# Patient Record
Sex: Female | Born: 2000 | Race: Black or African American | Hispanic: No | Marital: Single | Smoking: Never smoker
Health system: Southern US, Community
[De-identification: ages and names within clinical notes are randomized; demographics above are authoritative.]

---

## 2018-06-26 ENCOUNTER — Emergency Department (HOSPITAL_COMMUNITY)
Admission: EM | Admit: 2018-06-26 | Discharge: 2018-06-26 | Disposition: A | Discharge status: Other Acute Inpatient Hospital | Payer: BLUE CROSS/BLUE SHIELD | Source: Home / Self Care | Attending: Emergency Medicine | Admitting: Emergency Medicine

## 2018-06-26 ENCOUNTER — Other Ambulatory Visit: Payer: Self-pay

## 2018-06-26 ENCOUNTER — Inpatient Hospital Stay (HOSPITAL_COMMUNITY)
Admission: AD | Admit: 2018-06-26 | Discharge: 2018-06-29 | DRG: 881 | Disposition: A | Discharge status: Home / Self Care | Payer: BLUE CROSS/BLUE SHIELD | Source: Intra-hospital | Attending: Psychiatry | Admitting: Psychiatry

## 2018-06-26 ENCOUNTER — Encounter (HOSPITAL_COMMUNITY): Payer: Self-pay | Admitting: Emergency Medicine

## 2018-06-26 DIAGNOSIS — Z915 Personal history of self-harm: Secondary | ICD-10-CM

## 2018-06-26 DIAGNOSIS — R45851 Suicidal ideations: Secondary | ICD-10-CM

## 2018-06-26 DIAGNOSIS — T450X1A Poisoning by antiallergic and antiemetic drugs, accidental (unintentional), initial encounter: Secondary | ICD-10-CM | POA: Diagnosis present

## 2018-06-26 DIAGNOSIS — T450X2A Poisoning by antiallergic and antiemetic drugs, intentional self-harm, initial encounter: Secondary | ICD-10-CM | POA: Diagnosis present

## 2018-06-26 DIAGNOSIS — F322 Major depressive disorder, single episode, severe without psychotic features: Secondary | ICD-10-CM | POA: Diagnosis not present

## 2018-06-26 DIAGNOSIS — Z23 Encounter for immunization: Secondary | ICD-10-CM

## 2018-06-26 DIAGNOSIS — F329 Major depressive disorder, single episode, unspecified: Principal | ICD-10-CM | POA: Diagnosis present

## 2018-06-26 DIAGNOSIS — F333 Major depressive disorder, recurrent, severe with psychotic symptoms: Secondary | ICD-10-CM

## 2018-06-26 DIAGNOSIS — F419 Anxiety disorder, unspecified: Secondary | ICD-10-CM | POA: Diagnosis not present

## 2018-06-26 DIAGNOSIS — T391X2A Poisoning by 4-Aminophenol derivatives, intentional self-harm, initial encounter: Secondary | ICD-10-CM | POA: Diagnosis not present

## 2018-06-26 DIAGNOSIS — T1491XA Suicide attempt, initial encounter: Secondary | ICD-10-CM | POA: Diagnosis not present

## 2018-06-26 LAB — COMPREHENSIVE METABOLIC PANEL
ALBUMIN: 3.8 g/dL (ref 3.5–5.0)
ALT: 8 U/L (ref 0–44)
AST: 23 U/L (ref 15–41)
Alkaline Phosphatase: 47 U/L (ref 38–126)
Anion gap: 7 (ref 5–15)
BUN: 5 mg/dL — AB (ref 6–20)
CHLORIDE: 106 mmol/L (ref 98–111)
CO2: 25 mmol/L (ref 22–32)
CREATININE: 0.82 mg/dL (ref 0.44–1.00)
Calcium: 9.3 mg/dL (ref 8.9–10.3)
GFR calc Af Amer: >60 mL/min (ref >60)
GFR calc non Af Amer: >60 mL/min (ref >60)
GLUCOSE: 99 mg/dL (ref 70–99)
POTASSIUM: 3.5 mmol/L (ref 3.5–5.1)
SODIUM: 138 mmol/L (ref 135–145)
Total Bilirubin: 0.8 mg/dL (ref 0.3–1.2)
Total Protein: 7.1 g/dL (ref 6.5–8.1)

## 2018-06-26 LAB — ACETAMINOPHEN LEVEL
Acetaminophen (Tylenol), Serum: 125 ug/mL — ABNORMAL HIGH (ref 10–30)
Acetaminophen (Tylenol), Serum: 76 ug/mL — ABNORMAL HIGH (ref 10–30)

## 2018-06-26 LAB — SALICYLATE LEVEL: Salicylate Lvl: <7 mg/dL (ref 2.8–30.0)

## 2018-06-26 LAB — CBC
HEMATOCRIT: 40.5 % (ref 36.0–46.0)
Hemoglobin: 12 g/dL (ref 12.0–15.0)
MCH: 22.3 pg — ABNORMAL LOW (ref 26.0–34.0)
MCHC: 29.6 g/dL — ABNORMAL LOW (ref 30.0–36.0)
MCV: 75.3 fL — AB (ref 80.0–100.0)
NRBC: 0 % (ref 0.0–0.2)
Platelets: 325 10*3/uL (ref 150–400)
RBC: 5.38 MIL/uL — AB (ref 3.87–5.11)
RDW: 14.9 % (ref 11.5–15.5)
WBC: 8.2 10*3/uL (ref 4.0–10.5)

## 2018-06-26 LAB — I-STAT BETA HCG BLOOD, ED (MC, WL, AP ONLY)

## 2018-06-26 LAB — RAPID URINE DRUG SCREEN, HOSP PERFORMED
AMPHETAMINES: NOT DETECTED
BARBITURATES: NOT DETECTED
Benzodiazepines: NOT DETECTED
COCAINE: NOT DETECTED
Opiates: NOT DETECTED
Tetrahydrocannabinol: POSITIVE — AB

## 2018-06-26 LAB — ETHANOL

## 2018-06-26 MED ORDER — SODIUM CHLORIDE 0.9 % IV BOLUS
1000.0000 mL | Freq: Once | INTRAVENOUS | Status: AC
  Administered 2018-06-26: 1000 mL via INTRAVENOUS

## 2018-06-26 NOTE — ED Notes (Signed)
TTS at bedside. 

## 2018-06-26 NOTE — ED Notes (Signed)
Pt arrived to F8 - ambulatory - wearing burgundy scrubs and eyeglasses. Sitter w/pt. Pt voiced understanding of tx plan - accepted to [facility] 400-1. Pt signed consent form - copy faxed to [facility], copy sent to Medical Records, and original placed in envelope for [facility]. Pt attempting to make phone call from nurses' desk.

## 2018-06-26 NOTE — ED Notes (Signed)
Patient at RN station making phone call to family; pt asked RN if she will be able to leave because she feels she is ok at this point; pt was encouraged to continue with tx or face possible IVC for her safety; pt was tearful and understood; pt returned to room and is currently calm watching TV at this time-Monique,RN

## 2018-06-26 NOTE — ED Notes (Signed)
RN spoke with Revonda Standard at [employer] and given blood levels. RN confirmed pt is cleared medically.

## 2018-06-26 NOTE — BH Assessment (Signed)
Tele Assessment Note   Patient Name: Stephanie Winters MRN: [id number] Referring Physician: EDP Location of Patient: [facility] Location of Provider: Behavioral Health TTS Department  Stephanie Winters is a 17 y.o. female who presented to [facility] after intentionally overdosing on 15+ tabs of Midol.  Pt is a sophomore at [employer].  She lives with two roommates and does not have any outpatient psychiatric/therapy services.    Pt reported as follows:  Since the death of her father on 06-16-18, Pt has experienced the following symptoms:  Despondency; suicidal ideation; auditory hallucination (voices telling her that no one loves her); feelings of worthlessness and hopelessness; fatigue; guilt; poor concentration.  Pt reported also that this morning, following a conflict with her boyfriend, she intentionally overdosed on at least 15 tabs of Midol.  She did not describe it as a suicide attempt.  ''I was just trying to make everything stop.''  Pt denied any previous suicide attempts.  She also stated that she has a history of cutting.  Her most recent cutting was 06/26/18.  Pt denied substance use, homicidal ideation, and visual hallucination.  Pt reported that she felt safe to go home.  During assessment, Pt presented as alert and oriented.  She had good eye contact and was cooperative.  Pt's mood was depressed, and affect was blunted.  Pt endorsed intentional overdose, suicidal ideation, despondency, auditory hallucination, and other symptoms.  Pt denied any substance use concerns.  Pt's speech was normal in rate, rhythm, and volume.  Thought processes were within normal range.  Thought content included auditory hallucination.  Pt's memory and concentration were intact.  Pt's insight, judgment, and impulse control were poor.  Consulted with Ander Slade, NP, who determined that Pt meets inpatient criteria.  Diagnosis: Major Depressive Disorder, Recurrent, Severe, w/psychotic features  Past Medical History:  History reviewed. No pertinent past medical history.  Family History: No family history on file.  Social History:  reports that she does not drink alcohol or use drugs. Her tobacco history is not on file.  Additional Social History:  Alcohol / Drug Use Pain Medications: See MAR Prescriptions: See MAR Over the Counter: See MAR History of alcohol / drug use?: No history of alcohol / drug abuse  CIWA: CIWA-Ar BP: 127/81 Pulse Rate: 67 COWS:    Allergies: No Known Allergies  Home Medications:  (Not in a hospital admission)  OB/GYN Status:  Patient's last menstrual period was 06/16/2018.  General Assessment Data Location of Assessment: [facility] ED TTS Assessment: In system Is this a Tele or Face-to-Face Assessment?: Tele Assessment Is this an Initial Assessment or a Re-assessment for this encounter?: Initial Assessment Patient Accompanied by:: N/A Language Other than English: No Living Arrangements: Other (Comment)(Lives with two roommates) What gender do you identify as?: Female Marital status: Single Maiden name: Mortensen Pregnancy Status: No Living Arrangements: Other (Comment)(2 roommates) Can pt return to current living arrangement?: Yes Admission Status: Voluntary Is patient capable of signing voluntary admission?: Yes Referral Source: Self/Family/Friend Insurance type: [employer]     Crisis Care Plan Living Arrangements: Other (Comment)(2 roommates) Name of Psychiatrist: None Name of Therapist: None  Education Status Is patient currently in school?: Yes Current Grade: Sophomore Highest grade of school patient has completed: 1st year Name of school: [employer]  Risk to self with the past 6 months Suicidal Ideation: Yes-Currently Present Has patient been a risk to self within the past 6 months prior to admission? : No Suicidal Intent: Yes-Currently Present Has patient had any suicidal  intent within the past 6 months prior to admission? : No Is patient at risk for suicide?:  Yes Suicidal Plan?: Yes-Currently Present Has patient had any suicidal plan within the past 6 months prior to admission? : No Specify Current Suicidal Plan: Pt intentionally overdosed on 15+ Midol tabs Access to Means: Yes Specify Access to Suicidal Means: Midol tabs What has been your use of drugs/alcohol within the last 12 months?: Denied(UDS not available at time of assessment) Previous Attempts/Gestures: No Other Self Harm Risks: None indicated Intentional Self Injurious Behavior: Cutting Comment - Self Injurious Behavior: History of cutting -- cut today Family Suicide History: Unknown Recent stressful life event(s): Loss (Comment), Conflict (Comment)(Father died in 2023/06/03; conflict w/boyfriend) Persecutory voices/beliefs?: No Depression: Yes Depression Symptoms: Despondent, Tearfulness, Isolating, Guilt, Loss of interest in usual pleasures, Feeling worthless/self pity Substance abuse history and/or treatment for substance abuse?: No Suicide prevention information given to non-admitted patients: Not applicable  Risk to Others within the past 6 months Homicidal Ideation: No Does patient have any lifetime risk of violence toward others beyond the six months prior to admission? : No Thoughts of Harm to Others: No Current Homicidal Intent: No Current Homicidal Plan: No Access to Homicidal Means: No History of harm to others?: No Assessment of Violence: None Noted Does patient have access to weapons?: No Criminal Charges Pending?: No Does patient have a court date: No Is patient on probation?: No  Psychosis Hallucinations: Auditory(Voices -- see notes) Delusions: None noted  Mental Status Report Appearance/Hygiene: In scrubs, Unremarkable Eye Contact: Good Motor Activity: Freedom of movement, Unremarkable Speech: Logical/coherent Level of Consciousness: Alert Mood: Depressed Affect: Blunted, Appropriate to circumstance Anxiety Level: None Thought Processes: Relevant,  Coherent Judgement: Impaired Orientation: Person, Place, Time, Situation Obsessive Compulsive Thoughts/Behaviors: None  Cognitive Functioning Concentration: Normal Memory: Remote Intact, Recent Intact Is patient IDD: No Insight: Fair Impulse Control: Poor Appetite: Fair Have you had any weight changes? : No Change Sleep: No Change Total Hours of Sleep: 7 Vegetative Symptoms: None  ADLScreening [facility] Assessment Services) Patient's cognitive ability adequate to safely complete daily activities?: Yes Patient able to express need for assistance with ADLs?: Yes Independently performs ADLs?: Yes (appropriate for developmental age)  Prior Inpatient Therapy Prior Inpatient Therapy: No  Prior Outpatient Therapy Prior Outpatient Therapy: No Does patient have an ACCT team?: No Does patient have Intensive In-House Services?  : No Does patient have [facility] services? : No Does patient have P4CC services?: No  ADL Screening (condition at time of admission) Patient's cognitive ability adequate to safely complete daily activities?: Yes Is the patient deaf or have difficulty hearing?: No Does the patient have difficulty seeing, even when wearing glasses/contacts?: No Does the patient have difficulty concentrating, remembering, or making decisions?: No Patient able to express need for assistance with ADLs?: Yes Does the patient have difficulty dressing or bathing?: No Independently performs ADLs?: Yes (appropriate for developmental age) Does the patient have difficulty walking or climbing stairs?: No Weakness of Legs: None Weakness of Arms/Hands: None  Home Assistive Devices/Equipment Home Assistive Devices/Equipment: None  Therapy Consults (therapy consults require a physician order) PT Evaluation Needed: No OT Evalulation Needed: No SLP Evaluation Needed: No Abuse/Neglect Assessment (Assessment to be complete while patient is alone) Abuse/Neglect Assessment Can Be Completed:  Yes Physical Abuse: Denies Verbal Abuse: Denies Sexual Abuse: Denies Exploitation of patient/patient's resources: Denies Self-Neglect: Denies Values / Beliefs Cultural Requests During Hospitalization: None Spiritual Requests During Hospitalization: None Consults Spiritual Care Consult Needed: No Social Work Librarian, academic  Needed: No Advance Directives (For Healthcare) Does Patient Have a Medical Advance Directive?: No          Disposition:  Disposition Initial Assessment Completed for this Encounter: Yes Disposition of Patient: Admit Type of inpatient treatment program: Adult(Per T. Starkes, NP)  This service was provided via telemedicine using a 2-way, interactive audio and Immunologist.  Names of all persons participating in this telemedicine service and their role in this encounter. Name: Stephanie, Winters Role: Patient             Earline Mayotte 06/26/2018 4:10 PM

## 2018-06-26 NOTE — ED Triage Notes (Addendum)
Pt states her father passed away 06-06-2023 in his sleep. States she has been depressed since then and today she took about 15 pamprin, containing 500 mg tylenol each, 25mh of pamabrom each and 15 mg of pyrilamine maleate each. Pt tearful at triage, states her sister told her to come in. She states she does have a good relationship with her mom but she is hard to talk to about things. Pt also has noted cut marks to left wrist, no active bleeding she admits she also cut herself with her eye brow razor. Pt has been wanded by security.  Per poison control ALLISON: give 1g/kg Activated charcoal PO & 4hr post ingestion tylenol. watch for tachycardia and seizures. Treat with fluids/ativan as needed. Also get an EKG and Asprin level. Obs time of 6 hours from time of ingestion, as long as asymptomatic with normal vitals.

## 2018-06-26 NOTE — ED Notes (Signed)
Patient ambulatory to bathroom with steady gait at this time to provide urine sample 

## 2018-06-26 NOTE — ED Provider Notes (Signed)
[facility] EMERGENCY DEPARTMENT Provider Note   CSN: [id number] Arrival date & time: 06/26/18  1136     History   Chief Complaint Chief Complaint  Patient presents with  . Suicide Attempt  . Drug Overdose    HPI Stephanie Winters is a 17 y.o. female.  17 y.o female with no PMH presents to the ED s/p taking 15+ Midol pills this morning.  Patient reports she took two handful of Midol pills because she was trying to make the voices go away.She reports auditory hallucinations of people talk about her "they are talking bad about me". She also reports she's been sad as her boyfriend recently broke up with her. Patient reports her sister wanted her to come be evaluated but she feels ok. She denies having any previous SI attempts and reports she was not trying to hurt herself with the Midol but wanting the voices to stop. She denies any HI, visual hallucinations, chest pain or shortness of breath.      OB History   None      Home Medications    Prior to Admission medications   Not on File    Family History No family history on file.  Social History Social History   Tobacco Use  . Smoking status: Not on file  Substance Use Topics  . Alcohol use: Never    Frequency: Never    Comment: Pt denied  . Drug use: Never    Comment: Pt denied: UDS not available     Allergies   Patient has no known allergies.   Review of Systems Review of Systems  Constitutional: Negative for chills and fever.  Respiratory: Negative for shortness of breath.   Cardiovascular: Negative for chest pain.  Gastrointestinal: Negative for abdominal pain.  Genitourinary: Negative for dysuria and flank pain.  Musculoskeletal: Negative for back pain.  Skin: Negative for pallor and wound.  Neurological: Negative for light-headedness and headaches.  Psychiatric/Behavioral: Positive for hallucinations (auditory).  All other systems reviewed and are negative.    Physical  Exam Updated Vital Signs BP 109/73   Pulse 68   Temp 98.9 F (37.2 C)   Resp 15   LMP 06/16/2018   SpO2 100%   Physical Exam  Constitutional: She is oriented to person, place, and time. She appears well-developed and well-nourished.  HENT:  Head: Normocephalic and atraumatic.  Eyes: Pupils are equal, round, and reactive to light.  Neck: Normal range of motion. Neck supple.  Cardiovascular: Normal heart sounds.  Pulmonary/Chest: Effort normal.  Abdominal: Soft. There is no tenderness.  Musculoskeletal: She exhibits no tenderness.  Neurological: She is alert and oriented to person, place, and time.  Skin: Skin is warm and dry.  Nursing note and vitals reviewed.    ED Treatments / Results  Labs (all labs ordered are listed, but only abnormal results are displayed) Labs Reviewed  COMPREHENSIVE METABOLIC PANEL - Abnormal; Notable for the following components:      Result Value   BUN 5 (*)    All other components within normal limits  ACETAMINOPHEN LEVEL - Abnormal; Notable for the following components:   Acetaminophen (Tylenol), Serum 125 (*)    All other components within normal limits  CBC - Abnormal; Notable for the following components:   RBC 5.38 (*)    MCV 75.3 (*)    MCH 22.3 (*)    MCHC 29.6 (*)    All other components within normal limits  ACETAMINOPHEN LEVEL - Abnormal; Notable  for the following components:   Acetaminophen (Tylenol), Serum 76 (*)    All other components within normal limits  ETHANOL  SALICYLATE LEVEL  SALICYLATE LEVEL  RAPID URINE DRUG SCREEN, HOSP PERFORMED  I-STAT BETA HCG BLOOD, ED ([facility], [facility], [facility] ONLY)    EKG EKG Interpretation  Date/Time:  Sunday June 26 2018 12:03:39 EDT Ventricular Rate:  86 PR Interval:  136 QRS Duration: 66 QT Interval:  400 QTC Calculation: 478 R Axis:   90 Text Interpretation:  Normal sinus rhythm with sinus arrhythmia Rightward axis Septal infarct , age undetermined No previous tracing Confirmed by  Gwyneth Sprout ([id number]) on 06/26/2018 12:26:08 PM   Radiology No results found.  Procedures Procedures (including critical care time)  Medications Ordered in ED Medications  sodium chloride 0.9 % bolus 1,000 mL (0 mLs Intravenous Stopped 06/26/18 1531)     Initial Impression / Assessment and Plan / ED Course  I have reviewed the triage vital signs and the nursing notes.  Pertinent labs & imaging results that were available during my care of the patient were reviewed by me and considered in my medical decision making (see chart for details).    Presents after taking 13+ vitals in order to make the voices go away.  She reports has been hearing people talk about her and say bad stuff about her.  Patient also reports is been sad lately as her father's birthday is today and he passed away several years ago.  Patient also reports her boyfriend recently broke up with her which caused her to get really upset.  She does have 2 superficial lacerations to her left wrist and when asked about these patient reports she attempted to hurt her self this morning.  Patient denies any previous suicidal attempts, Ms. Ethelle Lyon ideations, visual hallucinations.  Poison control was contacted by nursing staff and myself, they recommended patient be monitor for 6 hours of observation along with repeating a 4-hour postingestion Tylenol and salicylate levels. I will provide patient with 1L bolus to treat help with her tachycardia. TTS consult will be placed after observation period.   Current acetaminophen level is 125, and ethanol <10, Salicylate < 7.0. CBC showed no leukocytosis, beta HCg was < 5.0. Consult to TTS placed.   5:36 PM Patient's repeat Salicylate level is <7.0, acetaminophen level is now 76. Patient's HR is now 68 after fluids, she is now medically cleared for TTS placement and evaluation.   Final Clinical Impressions(s) / ED Diagnoses   Final diagnoses:  Suicidal ideation    ED Discharge  Orders    None       Claude Manges, PA-C 06/26/18 1738    Gwyneth Sprout, MD 06/28/18 1407

## 2018-06-26 NOTE — ED Notes (Signed)
RN attempted to call report to [facility]; RN to call back-Monique,RN

## 2018-06-27 ENCOUNTER — Other Ambulatory Visit: Payer: Self-pay

## 2018-06-27 ENCOUNTER — Encounter (HOSPITAL_COMMUNITY): Payer: Self-pay

## 2018-06-27 DIAGNOSIS — F419 Anxiety disorder, unspecified: Secondary | ICD-10-CM

## 2018-06-27 DIAGNOSIS — T1491XA Suicide attempt, initial encounter: Secondary | ICD-10-CM

## 2018-06-27 DIAGNOSIS — F322 Major depressive disorder, single episode, severe without psychotic features: Secondary | ICD-10-CM | POA: Diagnosis present

## 2018-06-27 DIAGNOSIS — T450X2A Poisoning by antiallergic and antiemetic drugs, intentional self-harm, initial encounter: Secondary | ICD-10-CM

## 2018-06-27 DIAGNOSIS — T391X2A Poisoning by 4-Aminophenol derivatives, intentional self-harm, initial encounter: Secondary | ICD-10-CM

## 2018-06-27 LAB — LIPID PANEL
CHOLESTEROL: 121 mg/dL (ref 0–200)
HDL: 51 mg/dL (ref >40)
LDL CALC: 61 mg/dL (ref 0–99)
TRIGLYCERIDES: 43 mg/dL (ref <150)
Total CHOL/HDL Ratio: 2.4 RATIO
VLDL: 9 mg/dL (ref 0–40)

## 2018-06-27 LAB — TSH: TSH: 1.455 u[IU]/mL (ref 0.350–4.500)

## 2018-06-27 MED ORDER — HYDROXYZINE HCL 50 MG PO TABS
50.0000 mg | ORAL_TABLET | Freq: Once | ORAL | Status: AC
  Administered 2018-06-27: 50 mg via ORAL
  Filled 2018-06-27 (×2): qty 1

## 2018-06-27 MED ORDER — MAGNESIUM HYDROXIDE 400 MG/5ML PO SUSP: 30.0000 mL | Freq: Every day | ORAL | Status: DC | PRN

## 2018-06-27 MED ORDER — ACETAMINOPHEN 325 MG PO TABS
650.0000 mg | ORAL_TABLET | Freq: Four times a day (QID) | ORAL | Status: DC | PRN
  Filled 2018-06-27: qty 2

## 2018-06-27 MED ORDER — LORAZEPAM 0.5 MG PO TABS: 0.5000 mg | ORAL_TABLET | Freq: Four times a day (QID) | ORAL | Status: DC | PRN

## 2018-06-27 MED ORDER — ALUM & MAG HYDROXIDE-SIMETH 200-200-20 MG/5ML PO SUSP: 30.0000 mL | ORAL | Status: DC | PRN

## 2018-06-27 NOTE — Plan of Care (Addendum)
Patient was pleasant this morning upon approach. Patient said she slept okay, and her anxiety is under control this morning. Patient is not on any medications, denied SI HI AVH, and denied physical pain. Safety is maintained with 15 minute checks as well as environmental checks. Will continue to monitor.  Problem: Education: Goal: Ability to make informed decisions regarding treatment will improve Outcome: Progressing   Problem: Coping: Goal: Coping ability will improve Outcome: Progressing   Problem: Education: Goal: Knowledge of the prescribed therapeutic regimen will improve Outcome: Progressing   Problem: Coping: Goal: Coping ability will improve Outcome: Progressing Goal: Will verbalize feelings Outcome: Progressing

## 2018-06-27 NOTE — Progress Notes (Signed)
Recreation Therapy Notes  Date: 10.21.19 Time: 0930 Location: 300 Hall Dayroom  Group Topic: Stress Management  Goal Area(s) Addresses:  Patient will verbalize importance of using healthy stress management.  Patient will identify positive emotions associated with healthy stress management.   Intervention: Stress Management  Activity : Meditation.  LRT introduced the stress management technique of meditation.  LRT played a meditation that dealt with impermanence.  Patients were to follow along as the meditation played to engage in the meditation.  Education:  Stress Management, Discharge Planning.   Education Outcome: Acknowledges edcuation/In group clarification offered/Needs additional education  Clinical Observations/Feedback: Pt did not attend group.    Caroll Rancher, LRT/CTRS         Lillia Abed, [place] Pelto A 06/27/2018 11:26 AM
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
## 2018-06-27 NOTE — H&P (Addendum)
Psychiatric Admission Assessment Adult  Patient Identification: Stephanie Winters MRN:  [id number] Date of Evaluation:  06/27/2018 Chief Complaint:  " I was upset when I took the pills" Principal Diagnosis:  S/P Overdose  Diagnosis:   Patient Active Problem List   Diagnosis Date Noted  . MDD (major depressive disorder), single episode, severe (HCC) [F32.2] 06/27/2018   History of Present Illness: 17 year old female, college student, presented to the hospital ED after overdosing on Pamprin, Pyrilamine. States she took " a handful" of each. She states overdose was impulsive, unplanned, and states " I was feeling overwhelmed that day", related to conflict with her boyfriend.. States " I don't think I was trying to die, I really don't know what I was thinking". Also reports that her father passed away on 06-12-18.  States she was experiencing grief/loss,but reports " I was feeling better, and had started going back to classes and to work".  She states " really I think it was more of having a bad day than being depressed ". Currently does not endorse neuro-vegetative symptoms of depression, and reports sleep, energy level, appetite have been within normal, denies anhedonia. Denies any prior suicidal ideations . Of note initial [facility] assessment note indicates she reported some auditory hallucinations . At this time denies/does not endorse psychotic symptoms, does not appear internally preoccupied, no delusions expressed .   Associated Signs/Symptoms: Depression Symptoms:  suicidal attempt, (Hypo) Manic Symptoms:  None noted  Anxiety Symptoms:  Does not endorse Psychotic Symptoms:  Denies  PTSD Symptoms: Does not endorse  Total Time spent with patient: 45 minutes  Past Psychiatric History: no prior psychiatric admissions, no history of prior suicide attempts, reports history of an isolated episode of self cutting after her father passed away recently but no other history of self injurious  behaviors, no history of psychosis, denies history of PTSD. Denies history of violence . Denies panic or agoraphobia, denies social anxiety symptoms at this time  Is the patient at risk to self? Yes.    Has the patient been a risk to self in the past 6 months? No.  Has the patient been a risk to self within the distant past? No.  Is the patient a risk to others? No.  Has the patient been a risk to others in the past 6 months? No.  Has the patient been a risk to others within the distant past? No.   Prior Inpatient Therapy:  denies  Prior Outpatient Therapy:  no outpatient psychiatric services   Alcohol Screening: 1. How often do you have a drink containing alcohol?: Monthly or less 2. How many drinks containing alcohol do you have on a typical day when you are drinking?: 1 or 2 3. How often do you have six or more drinks on one occasion?: Never AUDIT-C Score: 1 4. How often during the last year have you found that you were not able to stop drinking once you had started?: Never 5. How often during the last year have you failed to do what was normally expected from you becasue of drinking?: Never 6. How often during the last year have you needed a first drink in the morning to get yourself going after a heavy drinking session?: Never 7. How often during the last year have you had a feeling of guilt of remorse after drinking?: Never 8. How often during the last year have you been unable to remember what happened the night before because you had been drinking?: Never  9. Have you or someone else been injured as a result of your drinking?: No 10. Has a relative or friend or a doctor or another health worker been concerned about your drinking or suggested you cut down?: No Alcohol Use Disorder Identification Test Final Score (AUDIT): 1 Intervention/Follow-up: AUDIT Score <7 follow-up not indicated Substance Abuse History in the last 12 months: denies alcohol abuse , denies drug abuse   Consequences of Substance Abuse: Denies  Previous Psychotropic Medications: reports she has not been prescribed psychiatric medications in the past and was not taking any medications on admission. Psychological Evaluations: no  Past Medical History: No history of medical illness, NKDA. Family History: father died on 06/06/18, states he was chronically ill, mother lives in [place]. She has 6 half siblings, has one twin sister  Family Psychiatric  History: no history of mental illness, cousin has history of drug abuse, no suicides in family Tobacco Screening: Have you used any form of tobacco in the last 30 days? 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 Social History   Substance and Sexual Activity  Alcohol Use Never  . Frequency: Never   Comment: Pt denied     Social History   Substance and Sexual Activity  Drug Use Yes  . Types: Marijuana   Comment: Pt denied: UDS not available    Additional Social History: Marital status: Long term relationship Long term relationship, how long?: 1 year  What types of issues is patient dealing with in the relationship?: Patient reports she and her boyfriend are "on the rocks". She states that she fears that they are on the verge to break up.  Additional relationship information: No Are you sexually active?: Yes What is your sexual orientation?: Heterosexual  Has your sexual activity been affected by drugs, alcohol, medication, or emotional stress?: No  Does patient have children?: No  Allergies:  Not on File Lab Results:  Results for orders placed or performed during the hospital encounter of 06/26/18 (from the past 48 hour(s))  Lipid panel     Status: None   Collection Time: 06/27/18  6:29 AM  Result Value Ref Range   Cholesterol 121 0 - 200 mg/dL    Triglycerides 43 <409 mg/dL   HDL 51 >81 mg/dL   Total CHOL/HDL Ratio 2.4 RATIO   VLDL 9 0 - 40 mg/dL   LDL Cholesterol 61 0 - 99 mg/dL    Comment:        Total Cholesterol/HDL:CHD Risk Coronary Heart Disease Risk Table                     Men   Women  1/2 Average Risk   3.4   3.3  Average Risk       5.0   4.4  2 X Average Risk   9.6   7.1  3 X Average Risk  23.4   11.0        Use the calculated Patient Ratio above and the CHD Risk Table to determine the patient's CHD Risk.        ATP III CLASSIFICATION (LDL):  <100     mg/dL   Optimal  191-478  mg/dL   Near or Above                    Optimal  130-159  mg/dL   Borderline  161-096  mg/dL   High  >045     mg/dL   Very High Performed at [facility], [address], [place], [place] [address]   TSH     Status: None   Collection Time: 06/27/18  6:29 AM  Result Value Ref Range   TSH 1.455 0.350 - 4.500 uIU/mL    Comment: Performed by a 3rd Generation assay with a functional sensitivity of <=0.01 uIU/mL. Performed at [facility], [address], [place], [place] [address]     Blood Alcohol level:  Lab Results  Component Value Date   ETH <10 06/26/2018    Metabolic Disorder Labs:  No results found for: HGBA1C, MPG No results found for: PROLACTIN Lab Results  Component Value Date   CHOL 121 06/27/2018   TRIG 43 06/27/2018   HDL 51 06/27/2018   CHOLHDL 2.4 06/27/2018   VLDL 9 06/27/2018   LDLCALC 61 06/27/2018    Current Medications: Current Facility-Administered Medications  Medication Dose Route Frequency Provider Last Rate Last Dose  . [START ON 06/28/2018] acetaminophen (TYLENOL) tablet 650 mg  650 mg Oral Q6H PRN Maryagnes Amos, FNP      . alum & mag hydroxide-simeth (MAALOX/MYLANTA) 200-200-20 MG/5ML suspension 30 mL  30 mL Oral Q4H PRN Starkes-Perry, Juel Burrow, FNP      . magnesium hydroxide (MILK OF MAGNESIA) suspension 30 mL  30 mL Oral Daily PRN Starkes-Perry,  Juel Burrow, FNP       PTA Medications: No medications prior to admission.    Musculoskeletal: Strength & Muscle Tone: within normal limits Gait & Station: normal Patient leans: N/A  Psychiatric Specialty Exam: Physical Exam  Review of Systems  Constitutional: Negative.   HENT: Negative.   Eyes: Negative.   Respiratory: Negative.   Cardiovascular: Negative.   Gastrointestinal: Negative.   Genitourinary: Negative.   Musculoskeletal: Negative.   Skin: Negative.   Neurological: Negative.   Endo/Heme/Allergies: Negative.   Psychiatric/Behavioral: Positive for depression. The patient is nervous/anxious.   All other systems reviewed and are negative.   Blood pressure 115/67, pulse 62, temperature 98.3 F (36.8 C), temperature source Oral, resp. rate 16, height 5\' 2"  (1.575 m), weight 75.3 kg, last menstrual period 06/16/2018.Body mass index is 30.36 kg/m.  General Appearance: Well Groomed  Eye Contact:  Good  Speech:  Normal Rate  Volume:  Normal  Mood:  reports improving mood, states " I feel better", describes mood as 8/10 with 10 being best   Affect:  Appropriate and reactive, vaguely anxious   Thought Process:  Linear and Descriptions of Associations: Intact  Orientation:  Full (Time, Place, and Person)  Thought Content:  no hallucinations, no delusions, not internally preoccupied   Suicidal Thoughts:  No denies suicidal or self injurious ideations , denies homicidal or violent ideations  Homicidal Thoughts:  No  Memory:  recent and remote grossly intact   Judgement:  Other:  fair  Insight:  Fair  Psychomotor Activity:  Normal  Concentration:  Concentration: Good and Attention Span: Good  Recall:  Good  Fund of Knowledge:  Good  Language:  Good  Akathisia:  Negative  Handed:  Right  AIMS (if indicated):     Assets:  Communication Skills Desire for Improvement Resilience  ADL's:  Intact  Cognition:  WNL  Sleep:  Number of Hours: 5    Treatment Plan  Summary: Daily contact with patient to assess and evaluate symptoms and  progress in treatment, Medication management, Plan inpatient treatment and medications as below  Observation Level/Precautions:  15 minute checks  Laboratory:  as needed   Will recheck Hepatic Function Tests, following recent acetaminophen overdose.    Psychotherapy:  Milieu, group therapy   Medications:  We discussed options- patient minimizes depression, reports overdose not associated with severe or significant depressive episode, at this time not interested in medication management / starting a standing psychiatric medication. Ativan 0.5 mgrs Q 6 hours PRN for anxiety as needed .   Consultations:  As needed   Discharge Concerns:  -  Estimated LOS: 3-4 days   Other:     Physician Treatment Plan for Primary Diagnosis: S/P Overdose  Long Term Goal(s): Improvement in symptoms so as ready for discharge  Short Term Goals: Ability to identify changes in lifestyle to reduce recurrence of condition will improve, Ability to verbalize feelings will improve, Ability to disclose and discuss suicidal ideas, Ability to demonstrate self-control will improve, Ability to identify and develop effective coping behaviors will improve and Ability to maintain clinical measurements within normal limits will improve  Physician Treatment Plan for Secondary Diagnosis: Bereavement  Long Term Goal(s): Improvement in symptoms so as ready for discharge  Short Term Goals: Ability to identify and develop effective coping behaviors will improve  I certify that inpatient services furnished can reasonably be expected to improve the patient's condition.    Craige Cotta, MD 10/21/20191:40 PM

## 2018-06-27 NOTE — BHH Group Notes (Signed)
LCSW Group Therapy Note 06/27/2018 1:52 PM  Type of Therapy and Topic: Group Therapy: Overcoming Obstacles  Participation Level: Active  Description of Group:  In this group patients will be encouraged to explore what they see as obstacles to their own wellness and recovery. They will be guided to discuss their thoughts, feelings, and behaviors related to these obstacles. The group will process together ways to cope with barriers, with attention given to specific choices patients can make. Each patient will be challenged to identify changes they are motivated to make in order to overcome their obstacles. This group will be process-oriented, with patients participating in exploration of their own experiences as well as giving and receiving support and challenge from other group members.  Therapeutic Goals: 1. Patient will identify personal and current obstacles as they relate to admission. 2. Patient will identify barriers that currently interfere with their wellness or overcoming obstacles.  3. Patient will identify feelings, thought process and behaviors related to these barriers. 4. Patient will identify two changes they are willing to make to overcome these obstacles:   Summary of Patient Progress  Stephanie Winters was engaged and participated throughout the group session. Stephanie Winters reports that she struggles with "the fear of failing". Stephanie Winters reports that her personal mindset is her main barrier and that she plans on learning how to manage her thinking and emotions.    Therapeutic Modalities:  Cognitive Behavioral Therapy Solution Focused Therapy Motivational Interviewing Relapse Prevention Therapy   Alcario Drought Clinical Social Worker

## 2018-06-27 NOTE — B[facility] Group Notes (Signed)
[facility] Group Notes:  (Nursing/MHT/Case Management/Adjunct)  Date:  06/27/2018  Time:  4:15 pm  Type of Therapy:  Psychoeducational Skills  Participation Level:  Active  Participation Quality:  Appropriate  Affect:  Appropriate  Cognitive:  Appropriate  Insight:  Appropriate  Engagement in Group:  Engaged  Modes of Intervention:  Education  Summary of Progress/Problems: Patient attended group, listened and responded appropriately.    Stephanie Winters 06/27/2018, 6:18 PM

## 2018-06-27 NOTE — Progress Notes (Signed)
Patient ID: Stephanie Winters, female   DOB: 2001-09-03, 17 y.o.   MRN: [id number]   D: Patient pleasant on approach today. Talking with peers. Reports mood improved. Currently denies feelings of depression or any active SI. Contracts on the unit. A: Staff will monitor on q 15 minute checks, follow treatment plan, and give medications as ordered. R: Cooperative on the unit.

## 2018-06-27 NOTE — Tx Team (Signed)
Initial Treatment Plan 06/27/2018 12:43 AM Stephanie Winters ZOX:[id number]    PATIENT STRESSORS: Loss of father 05/18/2018 Other: greiving    PATIENT STRENGTHS: Ability for insight Average or above average intelligence Communication skills General fund of knowledge Supportive family/friends   PATIENT IDENTIFIED PROBLEMS:   depression  anxiety  Risk for suicide  "wants to cope better"  "learn coping skills"  "learn to communicate feelings"          DISCHARGE CRITERIA:  Ability to meet basic life and health needs Adequate post-discharge living arrangements Improved stabilization in mood, thinking, and/or behavior Need for constant or close observation no longer present Verbal commitment to aftercare and medication compliance  PRELIMINARY DISCHARGE PLAN: Attend aftercare/continuing care group Participate in family therapy Return to previous living arrangement Return to previous work or school arrangements  PATIENT/FAMILY INVOLVEMENT: This treatment plan has been presented to and reviewed with the patient, Stephanie Winters,  The patient and family have been given the opportunity to ask questions and make suggestions.  Earnest Conroy, RN 06/27/2018, 12:43 AM

## 2018-06-27 NOTE — Progress Notes (Signed)
Adult Psychoeducational Group Note  Date:  06/27/2018 Time:  10:41 PM  Group Topic/Focus:  Wrap-Up Group:   The focus of this group is to help patients review their daily goal of treatment and discuss progress on daily workbooks.  Participation Level:  Active  Participation Quality:  Appropriate   Affect:  Appropriate  Cognitive:  Appropriate  Insight: Appropriate  Engagement in Group:  Engaged  Modes of Intervention:  Discussion  Additional Comments:  Pt's goals were to communicate and socialize more.  Pt stated that she met her goals.  Pt rated the day at a 10/10.  Dreon Pineda 06/27/2018, 10:41 PM

## 2018-06-27 NOTE — [facility] Suicide Risk Assessment (Addendum)
[facility] Admission Suicide Risk Assessment   Nursing information obtained from:  Patient Demographic factors:  Unemployed, Adolescent or young adult Current Mental Status:  Plan includes specific time, place, or method, Self-harm behaviors Loss Factors:  Loss of significant relationship Historical Factors:  Prior suicide attempts Risk Reduction Factors:  Positive social support  Total Time spent with patient: 45 minutes Principal Problem: S/P Overdose  Diagnosis:   Patient Active Problem List   Diagnosis Date Noted  . MDD (major depressive disorder), single episode, severe (HCC) [F32.2] 06/27/2018   Subjective Data:   Continued Clinical Symptoms:  Alcohol Use Disorder Identification Test Final Score (AUDIT): 1 The "Alcohol Use Disorders Identification Test", Guidelines for Use in Primary Care, Second Edition.  World Science writer [facility]). Score between 0-7:  no or low risk or alcohol related problems. Score between 8-15:  moderate risk of alcohol related problems. Score between 16-19:  high risk of alcohol related problems. Score 20 or above:  warrants further diagnostic evaluation for alcohol dependence and treatment.   CLINICAL FACTORS:  17 year old single female, college student, presented to ED following overdose on acetaminophen-containing medication.  Reports overdose was impulsive, unplanned and related to feeling acutely overwhelmed at the time.  Today minimizes depression or neurovegetative symptoms and states she was not experiencing any suicidal ideations prior to event.  Her father passed away on 04-Jun-2018, describes some bereavement but does not attribute overdose to this loss.  Psychiatric Specialty Exam: Physical Exam  ROS  Blood pressure 115/67, pulse 62, temperature 98.3 F (36.8 C), temperature source Oral, resp. rate 16, height 5\' 2"  (1.575 m), weight 75.3 kg, last menstrual period 06/16/2018.Body mass index is 30.36 kg/m.  See admit note  MSE  COGNITIVE FEATURES THAT CONTRIBUTE TO RISK:  Closed-mindedness and Loss of executive function    SUICIDE RISK:   Moderate:  Frequent suicidal ideation with limited intensity, and duration, some specificity in terms of plans, no associated intent, good self-control, limited dysphoria/symptomatology, some risk factors present, and identifiable protective factors, including available and accessible social support.  PLAN OF CARE: Patient will be admitted to inpatient psychiatric unit for stabilization and safety. Will provide and encourage milieu participation. Provide medication management and maked adjustments as needed.  Will follow daily.    I certify that inpatient services furnished can reasonably be expected to improve the patient's condition.   Craige Cotta, MD 06/27/2018, 2:08 PM

## 2018-06-27 NOTE — Tx Team (Signed)
Interdisciplinary Treatment and Diagnostic Plan Update  06/27/2018 Time of Session:  Stephanie Winters MRN: [id number]  Principal Diagnosis: <principal problem not specified>  Secondary Diagnoses: Active Problems:   MDD (major depressive disorder), single episode, severe (HCC)   Current Medications:  Current Facility-Administered Medications  Medication Dose Route Frequency Provider Last Rate Last Dose  . [START ON 06/28/2018] acetaminophen (TYLENOL) tablet 650 mg  650 mg Oral Q6H PRN Suella Broad, FNP      . alum & mag hydroxide-simeth (MAALOX/MYLANTA) 200-200-20 MG/5ML suspension 30 mL  30 mL Oral Q4H PRN Starkes-Perry, Gayland Curry, FNP      . magnesium hydroxide (MILK OF MAGNESIA) suspension 30 mL  30 mL Oral Daily PRN Starkes-Perry, Gayland Curry, FNP       PTA Medications: No medications prior to admission.    Patient Stressors: Loss of father 05/18/2018 Other: greiving   Patient Strengths: Ability for insight Average or above average intelligence Communication skills General fund of knowledge Supportive family/friends  Treatment Modalities: Medication Management, Group therapy, Case management,  1 to 1 session with clinician, Psychoeducation, Recreational therapy.   Physician Treatment Plan for Primary Diagnosis: <principal problem not specified> Long Term Goal(s):     Short Term Goals:    Medication Management: Evaluate patient's response, side effects, and tolerance of medication regimen.  Therapeutic Interventions: 1 to 1 sessions, Unit Group sessions and Medication administration.  Evaluation of Outcomes: Not Met  Physician Treatment Plan for Secondary Diagnosis: Active Problems:   MDD (major depressive disorder), single episode, severe (Stephanie Winters)  Long Term Goal(s):     Short Term Goals:       Medication Management: Evaluate patient's response, side effects, and tolerance of medication regimen.  Therapeutic Interventions: 1 to 1 sessions, Unit Group sessions  and Medication administration.  Evaluation of Outcomes: Not Met   RN Treatment Plan for Primary Diagnosis: <principal problem not specified> Long Term Goal(s): Knowledge of disease and therapeutic regimen to maintain health will improve  Short Term Goals: Ability to participate in decision making will improve, Ability to verbalize feelings will improve, Ability to disclose and discuss suicidal ideas and Ability to identify and develop effective coping behaviors will improve  Medication Management: RN will administer medications as ordered by provider, will assess and evaluate patient's response and provide education to patient for prescribed medication. RN will report any adverse and/or side effects to prescribing provider.  Therapeutic Interventions: 1 on 1 counseling sessions, Psychoeducation, Medication administration, Evaluate responses to treatment, Monitor vital signs and CBGs as ordered, Perform/monitor CIWA, COWS, AIMS and Fall Risk screenings as ordered, Perform wound care treatments as ordered.  Evaluation of Outcomes: Not Met   LCSW Treatment Plan for Primary Diagnosis: <principal problem not specified> Long Term Goal(s): Safe transition to appropriate next level of care at discharge, Engage patient in therapeutic group addressing interpersonal concerns.  Short Term Goals: Engage patient in aftercare planning with referrals and resources  Therapeutic Interventions: Assess for all discharge needs, 1 to 1 time with Social worker, Explore available resources and support systems, Assess for adequacy in community support network, Educate family and significant other(s) on suicide prevention, Complete Psychosocial Assessment, Interpersonal group therapy.  Evaluation of Outcome s: Not Met   Progress in Treatment: Attending groups: No. New to unit  Participating in groups: No. Taking medication as prescribed: Yes. Toleration medication: Yes. Family/Significant other contact made:  No, will contact:  if patient provides consent for collateral contacts Patient understands diagnosis: Yes. Discussing patient identified problems/goals with staff:  Yes. Medical problems stabilized or resolved: Yes. Denies suicidal/homicidal ideation: No. Issues/concerns per patient self-inventory: No. Other:   New problem(s) identified: None   New Short Term/Long Term Goal(s):medication stabilization, elimination of SI thoughts, development of comprehensive mental wellness plan.   Patient Goals:  I want to learn better coping skills and how to communicate my feelings  Discharge Plan or Barriers: CSW will assess for appropriate referrals and discharge planning.   Reason for Continuation of Hospitalization: Anxiety Depression Medication stabilization  Estimated Length of Stay: 3-5 days   Attendees: Patient: 06/27/2018 8:36 AM  Physician: Dr. Neita Garnet, MD 06/27/2018 8:36 AM  Nursing: Yetta Flock.B,RN 06/27/2018 8:36 AM  RN Care Manager: Lars Pinks, RN 06/27/2018 8:36 AM  Social Worker: Radonna Ricker, [place] 06/27/2018 8:36 AM  Recreational Therapist: Rhunette Croft 06/27/2018 8:36 AM  Other:  06/27/2018 8:36 AM  Other:  06/27/2018 8:36 AM  Other: 06/27/2018 8:36 AM    Scribe for Treatment Team: Marylee Floras, [place] 06/27/2018 8:36 AM

## 2018-06-27 NOTE — [facility] Suicide Risk Assessment (Signed)
[facility] INPATIENT:  Family/Significant Other Suicide Prevention Education  Suicide Prevention Education:  Contact Attempts: Rosemarie Beath, boyfriend [phone]) has been identified by the patient as the family member/significant other with whom the patient will be residing, and identified as the person(s) who will aid the patient in the event of a mental health crisis.  With written consent from the patient, two attempts were made to provide suicide prevention education, prior to and/or following the patient's discharge.  We were unsuccessful in providing suicide prevention education.  A suicide education pamphlet was given to the patient to share with family/significant other.  Date and time of first attempt: 06/27/18 / 4:00pm   Annalysse Shoemaker E Giavanni Zeitlin 06/27/2018, 4:00 PM

## 2018-06-27 NOTE — Progress Notes (Signed)
Patient ID: Stephanie Winters, female   DOB: [birth year], 17 y.o.   MRN: [id number] Admission note: Patient is a voluntary admission admitted for intentionally overdosing on 15+ tablet of Midol.  Pt is a sophomore at [employer].  She lives with two roommates and does not have any outpatient psychiatric/therapy services. Pt reports her father passed away on 2018-06-16. Pt reports she never followed up with the grieving therapy. Pt reports her boyfriend broke up with her but it was a miscommunication and she took the Midol in an attempt to hurt herself. Pt reports she started cutting after her father passed away to relieve pain. Pt has 2 superficial cut on left wrist and one on left upper arm. Belonging search done per protocol. Consent signed by pt. Pt educated on therapeutic milieu rules. Pt was introduced to milieu by nursing staff. Fall risk / suicide safety plan explained to the patient. 15 minutes checks started for safety.

## 2018-06-28 DIAGNOSIS — F322 Major depressive disorder, single episode, severe without psychotic features: Secondary | ICD-10-CM

## 2018-06-28 LAB — HEPATIC FUNCTION PANEL
ALK PHOS: 46 U/L (ref 38–126)
ALT: 14 U/L (ref 0–44)
AST: 22 U/L (ref 15–41)
Albumin: 3.4 g/dL — ABNORMAL LOW (ref 3.5–5.0)
BILIRUBIN DIRECT: 0.1 mg/dL (ref 0.0–0.2)
BILIRUBIN TOTAL: 0.6 mg/dL (ref 0.3–1.2)
Indirect Bilirubin: 0.5 mg/dL (ref 0.3–0.9)
Total Protein: 6.6 g/dL (ref 6.5–8.1)

## 2018-06-28 LAB — HEMOGLOBIN A1C
HEMOGLOBIN A1C: 5 % (ref 4.8–5.6)
Mean Plasma Glucose: 97 mg/dL

## 2018-06-28 MED ORDER — INFLUENZA VAC SPLIT QUAD 0.5 ML IM SUSY
0.5000 mL | PREFILLED_SYRINGE | INTRAMUSCULAR | Status: AC
  Administered 2018-06-29: 0.5 mL via INTRAMUSCULAR
  Filled 2018-06-28: qty 0.5

## 2018-06-28 NOTE — [facility] Group Notes (Signed)
LCSW Group Therapy Note 06/28/2018 12:11 PM  Type of Therapy/Topic: Group Therapy: Feelings about Diagnosis  Participation Level: Active   Description of Group:  This group will allow patients to explore their thoughts and feelings about diagnoses they have received. Patients will be guided to explore their level of understanding and acceptance of these diagnoses. Facilitator will encourage patients to process their thoughts and feelings about the reactions of others to their diagnosis and will guide patients in identifying ways to discuss their diagnosis with significant others in their lives. This group will be process-oriented, with patients participating in exploration of their own experiences, giving and receiving support, and processing challenge from other group members.  Therapeutic Goals: 1. Patient will demonstrate understanding of diagnosis as evidenced by identifying two or more symptoms of the disorder 2. Patient will be able to express two feelings regarding the diagnosis 3. Patient will demonstrate their ability to communicate their needs through discussion and/or role play  Summary of Patient Progress:  Stephanie Winters was engaged and participated throughout the group session. Stephanie Winters reports that she was confused when she learned he she had a mental health diagnosis, due to not experiencing symptoms in the past. Stephanie Winters reports while being in the hospital, she has learned a lot of information in regards to mental health and feels more confident with managing her mental health once she discharges.     Therapeutic Modalities:  Cognitive Behavioral Therapy Brief Therapy Feelings Identification    Zimri Brennen Catalina [place] Clinical Social Worker

## 2018-06-28 NOTE — [facility] Suicide Risk Assessment (Signed)
[facility] INPATIENT:  Family/Significant Other Suicide Prevention Education  Suicide Prevention Education:  Education Completed; Stephanie Winters, boyfriend [phone]) has been identified by the patient as the family member/significant other with whom the patient will be residing, and identified as the person(s) who will aid the patient in the event of a mental health crisis (suicidal ideations/suicide attempt).  With written consent from the patient, the family member/significant other has been provided the following suicide prevention education, prior to the and/or following the discharge of the patient.  The suicide prevention education provided includes the following:  Suicide risk factors  Suicide prevention and interventions  National Suicide Hotline telephone number  [facility] assessment telephone number  [facility] Emergency Assistance 911  [facility] and/or Residential Mobile Crisis Unit telephone number  Request made of family/significant other to:  Remove weapons (e.g., guns, rifles, knives), all items previously/currently identified as safety concern.    Remove drugs/medications (over-the-counter, prescriptions, illicit drugs), all items previously/currently identified as a safety concern.  The family member/significant other verbalizes understanding of the suicide prevention education information provided.  The family member/significant other agrees to remove the items of safety concern listed above.  Stephanie Winters 06/28/2018, 11:47 AM

## 2018-06-28 NOTE — Progress Notes (Signed)
D: Patient has been pleasant with staff and her peers.  She is a possible discharge for tomorrow.  She will follow up with [facility] counseling services.  She denies any depressive symptoms or thoughts of self harm.  She has been visible in the milieu.  Her goal today was "to leave with more skills.  I've learned a lot.  A: Continue to monitor medication management and MD orders.  Safety checks completed every 15 minutes per protocol.  Offer support and encouragement as needed.  R: Patient is receptive to staff; her behavior is appropriate.

## 2018-06-28 NOTE — Progress Notes (Signed)
[facility] MD Progress Note  06/28/2018 12:35 PM Stephanie Winters  MRN:  [id number] Subjective:  17 year old female, college student, presented to the hospital ED after overdosing on Pamprin, Pyrilamine. States she took " a handful" of each. She states overdose was impulsive, unplanned, and states " I was feeling overwhelmed that day", related to conflict with her boyfriend.. States " I don't think I was trying to die, I really don't know what I was thinking". Also reports that her father passed away on May 29, 2018.  States she was experiencing grief/loss,but reports " I was feeling better, and had started going back to classes and to work".  She states " really I think it was more of having a bad day than being depressed ". Currently does not endorse neuro-vegetative symptoms of depression, and reports sleep, energy level, appetite have been within normal, denies anhedonia. Denies any prior suicidal ideations . Of note initial [facility] assessment note indicates she reported some auditory hallucinations . At this time denies/does not endorse psychotic symptoms, does not appear internally preoccupied, no delusions expressed .   Objective: Patient is seen and examined.  Patient is a 17 year old female with a past psychiatric history significant for her depression who had been suffering from depressive symptoms since the death of her father on 05-29-2018.  The patient had been having problems with suicidal ideation, auditory hallucinations, feelings of worthlessness and hopelessness.  She overdosed on 15 tablets of Midol.  She also admitted at the time of admission of a history of cutting.  The last time she cut was 06/26/2018.  She was admitted to the hospital on 06/26/2018.  She has been written for lorazepam 0.5 mg p.o. every 6 hours as needed anxiety or sleep.  She prefers not to take medications at this time.  She stated she feels better today.  She denied any suicidal ideation today.  Vital signs are  stable, she is afebrile.  She slept 6.5 hours last night.  Review of her laboratories revealed a low MCV, MCH, and MCHC.  Drug screen on admission was positive for THC.  Blood alcohol was negative.  Principal Problem: <principal problem not specified> Diagnosis:   Patient Active Problem List   Diagnosis Date Noted  . MDD (major depressive disorder), single episode, severe (HCC) [F32.2] 06/27/2018   Total Time spent with patient: 20 minutes  Past Psychiatric History: See admission H&P  Past Medical History: History reviewed. No pertinent past medical history. History reviewed. No pertinent surgical history. Family History: History reviewed. No pertinent family history. Family Psychiatric  History: See admission H&P Social History:  Social History   Substance and Sexual Activity  Alcohol Use Never  . Frequency: Never   Comment: Pt denied     Social History   Substance and Sexual Activity  Drug Use Yes  . Types: Marijuana   Comment: Pt denied: UDS not available    Social History   Socioeconomic History  . Marital status: Single    Spouse name: Not on file  . Number of children: Not on file  . Years of education: Not on file  . Highest education level: Not on file  Occupational History  . Occupation: Consulting civil engineer  Social Needs  . Financial resource strain: Not on file  . Food insecurity:    Worry: Not on file    Inability: Not on file  . Transportation needs:    Medical: Not on file    Non-medical: Not on file  Tobacco  Use  . Smoking status: Never Smoker  . Smokeless tobacco: Never Used  Substance and Sexual Activity  . Alcohol use: Never    Frequency: Never    Comment: Pt denied  . Drug use: Yes    Types: Marijuana    Comment: Pt denied: UDS not available  . Sexual activity: Yes    Birth control/protection: Condom  Lifestyle  . Physical activity:    Days per week: Not on file    Minutes per session: Not on file  . Stress: Not on file  Relationships  . Social  connections:    Talks on phone: Not on file    Gets together: Not on file    Attends religious service: Not on file    Active member of club or organization: Not on file    Attends meetings of clubs or organizations: Not on file    Relationship status: Not on file  Other Topics Concern  . Not on file  Social History Narrative   Pt is a sophomore at [employer].   Additional Social History:                         Sleep: Fair  Appetite:  Fair  Current Medications: Current Facility-Administered Medications  Medication Dose Route Frequency Provider Last Rate Last Dose  . acetaminophen (TYLENOL) tablet 650 mg  650 mg Oral Q6H PRN Maryagnes Amos, FNP      . alum & mag hydroxide-simeth (MAALOX/MYLANTA) 200-200-20 MG/5ML suspension 30 mL  30 mL Oral Q4H PRN Rosario Adie, Juel Burrow, FNP      . [START ON 06/29/2018] Influenza vac split quadrivalent PF (FLUARIX) injection 0.5 mL  0.5 mL Intramuscular Tomorrow-1000 Antonieta Pert, MD      . LORazepam (ATIVAN) tablet 0.5 mg  0.5 mg Oral Q6H PRN Cobos, Rockey Situ, MD      . magnesium hydroxide (MILK OF MAGNESIA) suspension 30 mL  30 mL Oral Daily PRN Maryagnes Amos, FNP        Lab Results:  Results for orders placed or performed during the hospital encounter of 06/26/18 (from the past 48 hour(s))  Hemoglobin A1c     Status: None   Collection Time: 06/27/18  6:29 AM  Result Value Ref Range   Hgb A1c MFr Bld 5.0 4.8 - 5.6 %    Comment: (NOTE)         Prediabetes: 5.7 - 6.4         Diabetes: >6.4         Glycemic control for adults with diabetes: <7.0    Mean Plasma Glucose 97 mg/dL    Comment: (NOTE) Performed At: [facility] [address] [place], [place] [id number] Jolene Schimke MD WU:[id number]   Lipid panel     Status: None   Collection Time: 06/27/18  6:29 AM  Result Value Ref Range   Cholesterol 121 0 - 200 mg/dL   Triglycerides 43 <956 mg/dL   HDL 51 >21 mg/dL   Total CHOL/HDL Ratio 2.4  RATIO   VLDL 9 0 - 40 mg/dL   LDL Cholesterol 61 0 - 99 mg/dL    Comment:        Total Cholesterol/HDL:CHD Risk Coronary Heart Disease Risk Table                     Men   Women  1/2 Average Risk   3.4   3.3  Average Risk  5.0   4.4  2 X Average Risk   9.6   7.1  3 X Average Risk  23.4   11.0        Use the calculated Patient Ratio above and the CHD Risk Table to determine the patient's CHD Risk.        ATP III CLASSIFICATION (LDL):  <100     mg/dL   Optimal  161-096  mg/dL   Near or Above                    Optimal  130-159  mg/dL   Borderline  045-409  mg/dL   High  >811     mg/dL   Very High Performed at [facility], [address] [place], [place] [address]   TSH     Status: None   Collection Time: 06/27/18  6:29 AM  Result Value Ref Range   TSH 1.455 0.350 - 4.500 uIU/mL    Comment: Performed by a 3rd Generation assay with a functional sensitivity of <=0.01 uIU/mL. Performed at [facility], [address] [place], [place] [address]   Hepatic function panel     Status: Abnormal   Collection Time: 06/28/18  6:36 AM  Result Value Ref Range   Total Protein 6.6 6.5 - 8.1 g/dL   Albumin 3.4 (L) 3.5 - 5.0 g/dL   AST 22 15 - 41 U/L   ALT 14 0 - 44 U/L   Alkaline Phosphatase 46 38 - 126 U/L   Total Bilirubin 0.6 0.3 - 1.2 mg/dL   Bilirubin, Direct 0.1 0.0 - 0.2 mg/dL   Indirect Bilirubin 0.5 0.3 - 0.9 mg/dL    Comment: Performed at [facility], [address], [place], [place] [address]    Blood Alcohol level:  Lab Results  Component Value Date   ETH <10 06/26/2018    Metabolic Disorder Labs: Lab Results  Component Value Date   HGBA1C 5.0 06/27/2018   MPG 97 06/27/2018   No results found for: PROLACTIN Lab Results  Component Value Date   CHOL 121 06/27/2018   TRIG 43 06/27/2018   HDL 51 06/27/2018   CHOLHDL 2.4 06/27/2018   VLDL 9 06/27/2018   LDLCALC 61 06/27/2018    Physical  Findings: AIMS: Facial and Oral Movements Muscles of Facial Expression: None, normal Lips and Perioral Area: None, normal Jaw: None, normal Tongue: None, normal,Extremity Movements Upper (arms, wrists, hands, fingers): None, normal Lower (legs, knees, ankles, toes): None, normal, Trunk Movements Neck, shoulders, hips: None, normal, Overall Severity Severity of abnormal movements (highest score from questions above): None, normal Incapacitation due to abnormal movements: None, normal Patient's awareness of abnormal movements (rate only patient's report): No Awareness, Dental Status Current problems with teeth and/or dentures?: No Does patient usually wear dentures?: No  CIWA:    COWS:     Musculoskeletal: Strength & Muscle Tone: within normal limits Gait & Station: normal Patient leans: N/A  Psychiatric Specialty Exam: Physical Exam  Nursing note and vitals reviewed. Constitutional: She is oriented to person, place, and time. She appears well-developed and well-nourished.  HENT:  Head: Normocephalic and atraumatic.  Respiratory: Effort normal.  Neurological: She is alert and oriented to person, place, and time.    ROS  Blood pressure 116/65, pulse 73, temperature 99.1 F (37.3 C), temperature source Oral, resp. rate 20, height 5\' 2"  (1.575 m), weight 75.3 kg, last menstrual period 06/16/2018.Body mass index is 30.36 kg/m.  General Appearance: Casual  Eye Contact:  Fair  Speech:  Normal Rate  Volume:  Normal  Mood:  Anxious  Affect:  Congruent  Thought Process:  Coherent and Descriptions of Associations: Intact  Orientation:  Full (Time, Place, and Person)  Thought Content:  Logical  Suicidal Thoughts:  No  Homicidal Thoughts:  No  Memory:  Immediate;   Fair Recent;   Fair Remote;   Fair  Judgement:  Intact  Insight:  Fair  Psychomotor Activity:  Increased  Concentration:  Concentration: Fair and Attention Span: Fair  Recall:  [employer] of Knowledge:  Fair   Language:  Fair  Akathisia:  Negative  Handed:  Right  AIMS (if indicated):     Assets:  Communication Skills Desire for Improvement Financial Resources/Insurance Housing Physical Health Resilience Talents/Skills  ADL's:  Intact  Cognition:  WNL  Sleep:  Number of Hours: 6.5     Treatment Plan Summary: Daily contact with patient to assess and evaluate symptoms and progress in treatment, Medication management and Plan : Patient is seen and examined.  Patient is a 17 year old female with the above-stated past psychiatric history who is seen in follow-up.  #1 major depression-patient continues to minimize her depressive symptoms.  She does not believe that her overdose was associated with significant depressive symptoms.  I have recommended medication but she refuses at this time.  No change in this treatment.  #2 generalized anxiety disorder-she has Ativan 0.5 mg every 6 hours as needed anxiety or sleep as needed.  No change in this.  #3 discharge planning-in progress.  Antonieta Pert, MD 06/28/2018, 12:35 PM

## 2018-06-29 NOTE — Discharge Summary (Signed)
Physician Discharge Summary Note  Patient:  Stephanie Winters is an 17 y.o., female MRN:  [id number] DOB:  24-Nov-2000 Patient phone:  [phone] (home)  Patient address:   [address] [place] [address],  Total Time spent with patient: 20 minutes  Date of Admission:  06/26/2018 Date of Discharge: 06/29/18  Reason for Admission:  Worsening depression with SI  Principal Problem: MDD (major depressive disorder), single episode, severe [facility]) Discharge Diagnoses: Patient Active Problem List   Diagnosis Date Noted  . MDD (major depressive disorder), single episode, severe (HCC) [F32.2] 06/27/2018    Past Psychiatric History: no prior psychiatric admissions, no history of prior suicide attempts, reports history of an isolated episode of self cutting after her father passed away recently but no other history of self injurious behaviors, no history of psychosis, denies history of PTSD. Denies history of violence . Denies panic or agoraphobia, denies social anxiety symptoms at this time  Past Medical History: History reviewed. No pertinent past medical history. History reviewed. No pertinent surgical history. Family History: History reviewed. No pertinent family history. Family Psychiatric  History: no history of mental illness, cousin has history of drug abuse, no suicides in family Social History:  Social History   Substance and Sexual Activity  Alcohol Use Never  . Frequency: Never   Comment: Pt denied     Social History   Substance and Sexual Activity  Drug Use Yes  . Types: Marijuana   Comment: Pt denied: UDS not available    Social History   Socioeconomic History  . Marital status: Single    Spouse name: Not on file  . Number of children: Not on file  . Years of education: Not on file  . Highest education level: Not on file  Occupational History  . Occupation: Consulting civil engineer  Social Needs  . Financial resource strain: Not on file  . Food insecurity:    Worry: Not on file     Inability: Not on file  . Transportation needs:    Medical: Not on file    Non-medical: Not on file  Tobacco Use  . Smoking status: Never Smoker  . Smokeless tobacco: Never Used  Substance and Sexual Activity  . Alcohol use: Never    Frequency: Never    Comment: Pt denied  . Drug use: Yes    Types: Marijuana    Comment: Pt denied: UDS not available  . Sexual activity: Yes    Birth control/protection: Condom  Lifestyle  . Physical activity:    Days per week: Not on file    Minutes per session: Not on file  . Stress: Not on file  Relationships  . Social connections:    Talks on phone: Not on file    Gets together: Not on file    Attends religious service: Not on file    Active member of club or organization: Not on file    Attends meetings of clubs or organizations: Not on file    Relationship status: Not on file  Other Topics Concern  . Not on file  Social History Narrative   Pt is a sophomore at [employer].    Hospital Course:   06/27/18 [facility] MD Assessment:17 year old female, college student, presented to the hospital ED after overdosing on Pamprin, Pyrilamine. States she took " a handful" of each. She states overdose was impulsive, unplanned, and states " I was feeling overwhelmed that day", related to conflict with her boyfriend.. States " I don't think I  was trying to die, I really don't know what I was thinking". Also reports that her father passed away on Jun 09, 2018.  States she was experiencing grief/loss,but reports " I was feeling better, and had started going back to classes and to work".  She states " really I think it was more of having a bad day than being depressed ". Currently does not endorse neuro-vegetative symptoms of depression, and reports sleep, energy level, appetite have been within normal, denies anhedonia. Denies any prior suicidal ideations . Of note initial [facility] assessment note indicates she reported some auditory hallucinations . At this time  denies/does not endorse psychotic symptoms, does not appear internally preoccupied, no delusions expressed .   Patient remained on the [facility] unit for 2 days. The patient stabilized on medication and therapy. Patient was discharged on no medications. Patient has shown improvement with improved mood, affect, sleep, appetite, and interaction. Patient has attended group and participated. Patient has been seen in the day room interacting with peers and staff appropriately. Patient denies any SI/HI/AVH and contracts for safety. Patient agrees to follow up at [facility]. Patient is provided with prescriptions for their medications upon discharge.    Physical Findings: AIMS: Facial and Oral Movements Muscles of Facial Expression: None, normal Lips and Perioral Area: None, normal Jaw: None, normal Tongue: None, normal,Extremity Movements Upper (arms, wrists, hands, fingers): None, normal Lower (legs, knees, ankles, toes): None, normal, Trunk Movements Neck, shoulders, hips: None, normal, Overall Severity Severity of abnormal movements (highest score from questions above): None, normal Incapacitation due to abnormal movements: None, normal Patient's awareness of abnormal movements (rate only patient's report): No Awareness, Dental Status Current problems with teeth and/or dentures?: No Does patient usually wear dentures?: No  CIWA:    COWS:     Musculoskeletal: Strength & Muscle Tone: within normal limits Gait & Station: normal Patient leans: N/A  Psychiatric Specialty Exam: Physical Exam  Nursing note and vitals reviewed. Constitutional: She is oriented to person, place, and time. She appears well-developed and well-nourished.  Cardiovascular: Normal rate.  Respiratory: Effort normal.  Musculoskeletal: Normal range of motion.  Neurological: She is alert and oriented to person, place, and time.  Skin: Skin is warm.    Review of Systems  Constitutional: Negative.   HENT: Negative.    Eyes: Negative.   Respiratory: Negative.   Cardiovascular: Negative.   Gastrointestinal: Negative.   Genitourinary: Negative.   Musculoskeletal: Negative.   Skin: Negative.   Neurological: Negative.   Endo/Heme/Allergies: Negative.   Psychiatric/Behavioral: Negative.     Blood pressure 116/65, pulse 73, temperature 99.1 F (37.3 C), temperature source Oral, resp. rate 20, height 5\' 2"  (1.575 m), weight 75.3 kg, last menstrual period 06/16/2018.Body mass index is 30.36 kg/m.  General Appearance: Casual  Eye Contact:  Good  Speech:  Clear and Coherent and Normal Rate  Volume:  Normal  Mood:  Euthymic  Affect:  Congruent  Thought Process:  Coherent and Descriptions of Associations: Intact  Orientation:  Full (Time, Place, and Person)  Thought Content:  WDL  Suicidal Thoughts:  No  Homicidal Thoughts:  No  Memory:  Immediate;   Good Recent;   Good Remote;   Good  Judgement:  Intact  Insight:  Good  Psychomotor Activity:  Normal  Concentration:  Concentration: Good and Attention Span: Good  Recall:  Good  Fund of Knowledge:  Good  Language:  Good  Akathisia:  No  Handed:  Right  AIMS (if indicated):  Assets:  Communication Skills Desire for Improvement Financial Resources/Insurance Housing Physical Health Social Support Transportation  ADL's:  Intact  Cognition:  WNL  Sleep:  Number of Hours: 6.75     Have you used any form of tobacco in the last 30 days? (Cigarettes, Smokeless Tobacco, Cigars, and/or Pipes): No  Has this patient used any form of tobacco in the last 30 days? (Cigarettes, Smokeless Tobacco, Cigars, and/or Pipes) Yes, No  Blood Alcohol level:  Lab Results  Component Value Date   ETH <10 06/26/2018    Metabolic Disorder Labs:  Lab Results  Component Value Date   HGBA1C 5.0 06/27/2018   MPG 97 06/27/2018   No results found for: PROLACTIN Lab Results  Component Value Date   CHOL 121 06/27/2018   TRIG 43 06/27/2018   HDL 51 06/27/2018    CHOLHDL 2.4 06/27/2018   VLDL 9 06/27/2018   LDLCALC 61 06/27/2018    See Psychiatric Specialty Exam and Suicide Risk Assessment completed by Attending Physician prior to discharge.  Discharge destination:  Home  Is patient on multiple antipsychotic therapies at discharge:  No   Has Patient had three or more failed trials of antipsychotic monotherapy by history:  No  Recommended Plan for Multiple Antipsychotic Therapies: NA   Allergies as of 06/29/2018   Not on File     Medication List    You have not been prescribed any medications.    Follow-up Information    [employer] [facility] Go on 06/30/2018.   Why:  Appointment for intial therapy and medication management services is Thursday, 06/30/2018 at 10:00am. Please be sure to bring your Photo ID, [facility] ID #, and any discharge paperwork including your medications.  Contact information: [address], [place], [place] [address]  Phone; [phone] Fax: [phone]          Follow-up recommendations:  Continue activity as tolerated. Continue diet as recommended by your PCP. Ensure to keep all appointments with outpatient providers.  Comments:  Patient is instructed prior to discharge to: Take all medications as prescribed by his/her mental healthcare provider. Report any adverse effects and or reactions from the medicines to his/her outpatient provider promptly. Patient has been instructed & cautioned: To not engage in alcohol and or illegal drug use while on prescription medicines. In the event of worsening symptoms, patient is instructed to call the crisis hotline, 911 and or go to the nearest ED for appropriate evaluation and treatment of symptoms. To follow-up with his/her primary care provider for your other medical issues, concerns and or health care needs.    Signed: Gerlene Burdock Corrinne Benegas, FNP 06/29/2018, 10:03 AM

## 2018-06-29 NOTE — Progress Notes (Signed)
Pt reports she is doing well and feels better that when she was admitted over the weekend.  She denies SI/HI/AVH, and says she is supposed to discharge tomorrow.  Pt makes her needs known to staff.  Pt is pleasant and appropriate on the unit.  Support and encouragement offered.  Discharge plans are in process.  Safety maintained with q15 minute checks.

## 2018-06-29 NOTE — Progress Notes (Signed)
Patient ID: Stephanie Winters, female   DOB: 05-23-2001, 17 y.o.   MRN: [id number]  Pt. Discharged per MD orders;  PT. Currently denies any HI/SI or AVH.  Pt. Was given education regarding follow up appointments and medications by RN.  Pt. Denies any questions or concerns about the medications.  Pt. Was escorted to the search room to retrieve her belongings by RN before being discharged to the hospital lobby.

## 2018-06-29 NOTE — [facility] Suicide Risk Assessment (Signed)
[facility] Discharge Suicide Risk Assessment   Principal Problem: <principal problem not specified> Discharge Diagnoses:  Patient Active Problem List   Diagnosis Date Noted  . MDD (major depressive disorder), single episode, severe (HCC) [F32.2] 06/27/2018    Total Time spent with patient: 15 minutes  Musculoskeletal: Strength & Muscle Tone: within normal limits Gait & Station: normal Patient leans: N/A  Psychiatric Specialty Exam: Review of Systems  All other systems reviewed and are negative.   Blood pressure 116/65, pulse 73, temperature 99.1 F (37.3 C), temperature source Oral, resp. rate 20, height 5\' 2"  (1.575 m), weight 75.3 kg, last menstrual period 06/16/2018.Body mass index is 30.36 kg/m.  General Appearance: Casual  Eye Contact::  Good  Speech:  Normal Rate409  Volume:  Normal  Mood:  Euthymic  Affect:  Congruent  Thought Process:  Coherent and Descriptions of Associations: Intact  Orientation:  Full (Time, Place, and Person)  Thought Content:  Logical  Suicidal Thoughts:  No  Homicidal Thoughts:  No  Memory:  Immediate;   Fair Recent;   Fair Remote;   Fair  Judgement:  Intact  Insight:  Good  Psychomotor Activity:  Normal  Concentration:  Good  Recall:  Good  Fund of Knowledge:Good  Language: Good  Akathisia:  Negative  Handed:  Right  AIMS (if indicated):     Assets:  Communication Skills Desire for Improvement Financial Resources/Insurance Housing Physical Health Resilience Social Support  Sleep:  Number of Hours: 6.75  Cognition: WNL  ADL's:  Intact   Mental Status Per Nursing Assessment::   On Admission:  Plan includes specific time, place, or method, Self-harm behaviors  Demographic Factors:  Adolescent or young adult  Loss Factors: Loss of significant relationship  Historical Factors: Impulsivity  Risk Reduction Factors:   Sense of responsibility to family, Living with another person, especially a relative, Positive social support  and Positive coping skills or problem solving skills  Continued Clinical Symptoms:  Depression:   Impulsivity  Cognitive Features That Contribute To Risk:  None    Suicide Risk:  Minimal: No identifiable suicidal ideation.  Patients presenting with no risk factors but with morbid ruminations; may be classified as minimal risk based on the severity of the depressive symptoms  Follow-up Information    [facility]. Go on 06/30/2018.   Why:  Appointment for intial therapy and medication management services is Thursday, 06/30/2018 at 10:00am. Please be sure to bring your Photo ID, [facility] ID #, and any discharge paperwork including your medications.  Contact information: [address], [place], [place] [address]  Phone; [phone] Fax: [phone]          Plan Of Care/Follow-up recommendations:  Activity:  ad lib  Antonieta Pert, MD 06/29/2018, 9:26 AM

## 2018-06-29 NOTE — Progress Notes (Signed)
  [facility] Adult Case Management Discharge Plan :  Will you be returning to the same living situation after discharge:  Yes,  patient reports she is returning to her apartment at discharge At discharge, do you have transportation home?: Yes,  patient reports her brother is picking her up at discharge Do you have the ability to pay for your medications: Yes,  income from employment; [employer]  Release of information consent forms completed and in the chart;  Patient's signature needed at discharge.  Patient to Follow up at: Follow-up Information    [facility]. Go on 06/30/2018.   Why:  Appointment for intial therapy and medication management services is Thursday, 06/30/2018 at 10:00am. Please be sure to bring your Photo ID, [facility] ID #, and any discharge paperwork including your medications.  Contact information: [address], [place], [place] [address]  Phone; [phone] Fax: [phone]          Next level of care provider has access to [facility] Link:yes  Safety Planning and Suicide Prevention discussed: Yes,  with the patient's boyfriend  Have you used any form of tobacco in the last 30 days? (Cigarettes, Smokeless Tobacco, Cigars, and/or Pipes): No  Has patient been referred to the Quitline?: N/A patient is not a smoker  Patient has been referred for addiction treatment: N/A  Maeola Sarah, LCSWA 06/29/2018, 11:04 AM

## 2018-06-29 NOTE — Progress Notes (Signed)
Recreation Therapy Notes  Date: 10.23.19 Time: 0930 Location: 300 Hall Dayroom  Group Topic: Stress Management  Goal Area(s) Addresses:  Patient will verbalize importance of using healthy stress management.  Patient will identify positive emotions associated with healthy stress management.   Intervention: Stress Management  Activity :  Guided Imagery.  LRT introduced the stress management technique of guided imagery.  LRT read a script to allow patients to envision their peaceful place.  Patients were to follow along as script was read to engage in the activity.  Education:  Stress Management, Discharge Planning.   Education Outcome: Acknowledges edcuation/In group clarification offered/Needs additional education  Clinical Observations/Feedback: Pt did not attend group.      Erskin Zinda, LRT/CTRS         Khyle Goodell A 06/29/2018 11:23 AM 

## 2018-06-29 NOTE — Progress Notes (Signed)
Adult Psychoeducational Group Note  Date:  06/29/2018 Time:  5:00 AM  Group Topic/Focus:  Wrap-Up Group:   The focus of this group is to help patients review their daily goal of treatment and discuss progress on daily workbooks.  Participation Level:  Active  Participation Quality:  Appropriate  Affect:  Appropriate  Cognitive:  Appropriate  Insight: Appropriate  Engagement in Group:  Engaged  Modes of Intervention:  Discussion  Additional Comments:  Pt attend wrap up group. Her day was a 10. She gets to go home tomorrow. Her family came for a visit.  Charna Busman Long 06/29/2018, 5:00 AM

## 2019-04-23 ENCOUNTER — Encounter (HOSPITAL_COMMUNITY): Payer: Self-pay | Admitting: Emergency Medicine

## 2019-04-23 ENCOUNTER — Other Ambulatory Visit: Payer: Self-pay

## 2019-04-23 ENCOUNTER — Emergency Department (HOSPITAL_COMMUNITY)
Admission: EM | Admit: 2019-04-23 | Discharge: 2019-04-23 | Disposition: A | Discharge status: Home / Self Care | Payer: BC Managed Care – PPO | Attending: Emergency Medicine | Admitting: Emergency Medicine

## 2019-04-23 DIAGNOSIS — B9689 Other specified bacterial agents as the cause of diseases classified elsewhere: Secondary | ICD-10-CM | POA: Diagnosis not present

## 2019-04-23 DIAGNOSIS — Z202 Contact with and (suspected) exposure to infections with a predominantly sexual mode of transmission: Secondary | ICD-10-CM | POA: Diagnosis not present

## 2019-04-23 DIAGNOSIS — Z711 Person with feared health complaint in whom no diagnosis is made: Secondary | ICD-10-CM | POA: Diagnosis not present

## 2019-04-23 DIAGNOSIS — N898 Other specified noninflammatory disorders of vagina: Secondary | ICD-10-CM | POA: Diagnosis present

## 2019-04-23 DIAGNOSIS — N760 Acute vaginitis: Secondary | ICD-10-CM | POA: Insufficient documentation

## 2019-04-23 DIAGNOSIS — B370 Candidal stomatitis: Secondary | ICD-10-CM | POA: Diagnosis not present

## 2019-04-23 LAB — URINALYSIS, ROUTINE W REFLEX MICROSCOPIC
Bilirubin Urine: NEGATIVE
Glucose, UA: NEGATIVE mg/dL
Hgb urine dipstick: NEGATIVE
Ketones, ur: NEGATIVE mg/dL
Leukocytes,Ua: NEGATIVE
Nitrite: NEGATIVE
Protein, ur: NEGATIVE mg/dL
Specific Gravity, Urine: 1.01 (ref 1.005–1.030)
pH: 8 (ref 5.0–8.0)

## 2019-04-23 LAB — WET PREP, GENITAL
Sperm: NONE SEEN
Trich, Wet Prep: NONE SEEN
Yeast Wet Prep HPF POC: NONE SEEN

## 2019-04-23 LAB — PREGNANCY, URINE: Preg Test, Ur: NEGATIVE

## 2019-04-23 MED ORDER — METRONIDAZOLE 500 MG PO TABS: 500.0000 mg | ORAL_TABLET | Freq: Two times a day (BID) | ORAL | 0 refills | Status: DC

## 2019-04-23 MED ORDER — AZITHROMYCIN 250 MG PO TABS
1000.0000 mg | ORAL_TABLET | Freq: Once | ORAL | Status: AC
  Administered 2019-04-23: 1000 mg via ORAL
  Filled 2019-04-23: qty 4

## 2019-04-23 MED ORDER — NYSTATIN 100000 UNIT/ML MT SUSP: 500000.0000 [IU] | Freq: Four times a day (QID) | OROMUCOSAL | 0 refills | Status: DC

## 2019-04-23 MED ORDER — STERILE WATER FOR INJECTION IJ SOLN
INTRAMUSCULAR | Status: AC
  Administered 2019-04-23: 10 mL
  Filled 2019-04-23: qty 10

## 2019-04-23 MED ORDER — CEFTRIAXONE SODIUM 250 MG IJ SOLR
250.0000 mg | Freq: Once | INTRAMUSCULAR | Status: AC
  Administered 2019-04-23: 250 mg via INTRAMUSCULAR
  Filled 2019-04-23: qty 250

## 2019-04-23 NOTE — Discharge Instructions (Addendum)
You have been diagnosed today with bacterial vaginosis and concern for STD.  At this time there does not appear to be the presence of an emergent medical condition, however there is always the potential for conditions to change. Please read and follow the below instructions.  Please return to the Emergency Department immediately for any new or worsening symptoms. Please be sure to follow up with your Primary Care Provider within one week regarding your visit today; please call their office to schedule an appointment even if you are feeling better for a follow-up visit. You have been treated presumptively today for gonorrhea and chlamydia. You have been tested today for gonorrhea and chlamydia as well as HIV and syphilis. These results will be available in approximately 3 days. You may check your MyChart account for results. Please inform all sexual partners of positive results and that they should be tested and treated as well. Please wait 2 weeks and be sure that you and your partners are symptom free before returning to sexual activity. Please use protection with every sexual encounter. Please take the medication Flagyl as prescribed for treatment of your bacterial vaginosis.  Do not drink alcohol while taking Flagyl as this will make you sick and vomit. You may use the nystatin mouth rinse as prescribed for your thrush.  Rinse and spit this medication out.  Do not swallow this medication.  Please follow-up with your primary care provider this week for reevaluation and to ensure improvement of your thrush.  Follow Up: Please followup with your primary doctor in 3 days for discussion of your diagnoses and further evaluation after today's visit; if you do not have a primary care doctor use the resource guide provided to find one; Please return to the ER for worsening symptoms, high fevers or persistent vomiting.  Get help right away if: Your symptoms do not get better, even after you are treated. You  have more discharge or pain when you pee (urinate). You have a fever. You have pain in your belly (abdomen). You vomit You have pain with sex. Your bleed from your vagina between periods. You have swelling of your face, head or neck You have difficulty breathing or difficulty swallowing You have any new/concerning or worsening symptoms  Please read the additional information packets attached to your discharge summary.  Do not take your medicine if  develop an itchy rash, swelling in your mouth or lips, or difficulty breathing; call 911 and seek immediate emergency medical attention if this occurs.

## 2019-04-23 NOTE — ED Triage Notes (Signed)
Pt here for eval of 1-2 years of white vaginal discharge, and oral "trush" and bumps. States she has been w same partner. Got test 1 month ago for STDS and was told she did not have any.

## 2019-04-23 NOTE — ED Notes (Signed)
Patient verbalizes understanding of discharge instructions. Opportunity for questioning and answers were provided. Armband removed by staff, pt discharged from ED.  

## 2019-04-23 NOTE — ED Provider Notes (Addendum)
[facility]ENC[facility] Provider Note   CSN: [id number] Arrival date & time: 04/23/19  1011    History   Chief Complaint Chief Complaint  Patient presents with  . Vaginal Discharge  . Thrush    HPI Stephanie Winters is a 18 y.o. female presents to this emergency department for concern of STD.  Patient reports that she has had intermittent white vaginal discharge for the past 2 years.  She reports unprotected sex with 1 partner and is now concerned for possible sexually transmitted disease.  She does not have an OB/GYN.  She denies any pain or vaginal bleeding.  Patient denies dysuria or hematuria. She denies any change in her vaginal discharge recently.  Denies fever, chills, abdominal pain, nausea/vomiting, diarrhea or any additional concerns today.  At discharge patient reports oral thrush that has been present for the past few days, a small amount of white patchiness on her tongue, reports this is new she denies history immunocompromising diseases or HIV.    HPI  History reviewed. No pertinent past medical history.  Patient Active Problem List   Diagnosis Date Noted  . MDD (major depressive disorder), single episode, severe (HCC) 06/27/2018    History reviewed. No pertinent surgical history.   OB History   No obstetric history on file.      Home Medications    Prior to Admission medications   Medication Sig Start Date End Date Taking? Authorizing Provider  metroNIDAZOLE (FLAGYL) 500 MG tablet Take 1 tablet (500 mg total) by mouth 2 (two) times daily. 04/23/19   Bill SalinasMorelli, Delorice Bannister A, PA-C    Family History No family history on file.  Social History Social History   Tobacco Use  . Smoking status: Never Smoker  . Smokeless tobacco: Never Used  Substance Use Topics  . Alcohol use: Never    Frequency: Never    Comment: Pt denied  . Drug use: Yes    Types: Marijuana    Comment: Pt denied: UDS not available     Allergies   Patient has  no known allergies.   Review of Systems Review of Systems Ten systems are reviewed and are negative for acute change except as noted in the HPI   Physical Exam Updated Vital Signs BP 120/70 (BP Location: Right Arm)   Pulse 74   Temp 99.1 F (37.3 C) (Oral)   Resp 12   Ht 5\' 2"  (1.575 m)   Wt 59 kg   LMP 04/20/2019   SpO2 100%   BMI 23.78 kg/m   Physical Exam Constitutional:      General: She is not in acute distress.    Appearance: Normal appearance. She is well-developed. She is not ill-appearing or diaphoretic.  HENT:     Head: Normocephalic and atraumatic.     Right Ear: External ear normal.     Left Ear: External ear normal.     Nose: Nose normal.     Mouth/Throat:     Comments: Thin white patchiness of tongue consistent with oral candidiasis, does not extend into posterior oropharynx.  The patient has normal phonation and is in control of secretions. No stridor.  Midline uvula without edema. Soft palate rises symmetrically. No tonsillar erythema, swelling or exudates. Tongue protrusion is normal, floor of mouth is soft. No trismus. No creptius on neck palpation. No gingival erythema or fluctuance noted. Eyes:     General: Vision grossly intact. Gaze aligned appropriately.     Pupils: Pupils are equal, round,  and reactive to light.  Neck:     Musculoskeletal: Normal range of motion.     Trachea: Trachea and phonation normal. No tracheal deviation.  Pulmonary:     Effort: Pulmonary effort is normal. No respiratory distress.  Abdominal:     General: There is no distension.     Palpations: Abdomen is soft.     Tenderness: There is no abdominal tenderness. There is no guarding or rebound.  Genitourinary:    Comments: Exam chaperoned by Leeroy Bockhelsea NT.  Pelvic exam: normal external genitalia without evidence of trauma. VULVA: normal appearing vulva with no masses, tenderness or lesion. VAGINA: normal appearing vagina with normal color and discharge, no lesions. CERVIX:  normal appearing cervix without lesions, cervical motion tenderness absent, cervical os closed without purulent discharge; vaginal discharge white Wet prep and DNA probe for chlamydia and GC obtained.   ADNEXA: normal adnexa in size, nontender and no masses UTERUS: uterus is normal size, shape, consistency and nontender.  Musculoskeletal: Normal range of motion.  Skin:    General: Skin is warm and dry.  Neurological:     Mental Status: She is alert.     GCS: GCS eye subscore is 4. GCS verbal subscore is 5. GCS motor subscore is 6.     Comments: Speech is clear and goal oriented, follows commands Major Cranial nerves without deficit, no facial droop Moves extremities without ataxia, coordination intact  Psychiatric:        Behavior: Behavior normal.    ED Treatments / Results  Labs (all labs ordered are listed, but only abnormal results are displayed) Labs Reviewed  WET PREP, GENITAL - Abnormal; Notable for the following components:      Result Value   Clue Cells Wet Prep HPF POC PRESENT (*)    WBC, Wet Prep HPF POC MODERATE (*)    All other components within normal limits  URINALYSIS, ROUTINE W REFLEX MICROSCOPIC  PREGNANCY, URINE  RPR  HIV ANTIBODY (ROUTINE TESTING W REFLEX)  GC/CHLAMYDIA PROBE AMP ([place]    EKG None  Radiology No results found.  Procedures Procedures (including critical care time)  Medications Ordered in ED Medications  cefTRIAXone (ROCEPHIN) injection 250 mg (has no administration in time range)  azithromycin (ZITHROMAX) tablet 1,000 mg (has no administration in time range)     Initial Impression / Assessment and Plan / ED Course  I have reviewed the triage vital signs and the nursing notes.  Pertinent labs & imaging results that were available during my care of the patient were reviewed by me and considered in my medical decision making (see chart for details).    Urinalysis negative Wet prep with clue cells and white  blood cells Pregnancy test negative  Patient understands that they have GC/Chlamydia cultures pending and that they will need to inform all sexual partners if results return positive. Patient has been treated prophylactically with azithromycin and Rocephin due to patient's history, pelvic exam, and wet prep with increased WBCs. Pt not concerning for PID because hemodynamically stable and no cervical motion tenderness on pelvic exam. Patient has also been treated with Flagyl for Bacterial Vaginosis. Patient has been advised to not drink alcohol while on this medication. Patient to be discharged with instructions to follow up with OBGYN/PCP. Discussed importance of using protection when sexually active.  Discussed safe sex with the patient she states understanding.  Additionally patient with mild oral thrush today, isolated to her tongue, has appeared over the past few  days.  She denies any immunocompromising diseases including HIV, her HIV test is currently pending.  Does not extend to the esophagus or posterior oropharynx.  Will treat with topical nystatin and encourage PCP follow-up for resolution this week.   At this time there does not appear to be any evidence of an acute emergency medical condition and the patient appears stable for discharge with appropriate outpatient follow up. Diagnosis was discussed with patient who verbalizes understanding of care plan and is agreeable to discharge. I have discussed return precautions with patient who verbalizes understanding of return precautions. Patient encouraged to follow-up with their PCP and OBGYN. All questions answered. Patient has been discharged in good condition.  Note: Portions of this report may have been transcribed using voice recognition software. Every effort was made to ensure accuracy; however, inadvertent computerized transcription errors may still be present. Final Clinical Impressions(s) / ED Diagnoses   Final diagnoses:  Bacterial  vaginosis  Concern about STD in female without diagnosis    ED Discharge Orders         Ordered    metroNIDAZOLE (FLAGYL) 500 MG tablet  2 times daily     04/23/19 1240           Gari Crown 04/23/19 1241    Gari Crown 04/23/19 1256    Gareth Morgan, MD 04/25/19 [phone]

## 2019-04-25 LAB — GC/CHLAMYDIA PROBE AMP (~~LOC~~) NOT AT ARMC
Chlamydia: NEGATIVE
Neisseria Gonorrhea: NEGATIVE

## 2019-04-25 LAB — HIV ANTIBODY (ROUTINE TESTING W REFLEX): HIV Screen 4th Generation wRfx: NONREACTIVE

## 2019-04-25 LAB — RPR: RPR Ser Ql: NONREACTIVE

## 2019-06-24 ENCOUNTER — Emergency Department (HOSPITAL_COMMUNITY)
Admission: EM | Admit: 2019-06-24 | Discharge: 2019-06-24 | Disposition: A | Discharge status: Home / Self Care | Payer: BC Managed Care – PPO | Attending: Emergency Medicine | Admitting: Emergency Medicine

## 2019-06-24 ENCOUNTER — Emergency Department (HOSPITAL_COMMUNITY): Payer: BC Managed Care – PPO

## 2019-06-24 ENCOUNTER — Encounter (HOSPITAL_COMMUNITY): Payer: Self-pay | Admitting: Emergency Medicine

## 2019-06-24 DIAGNOSIS — S52502A Unspecified fracture of the lower end of left radius, initial encounter for closed fracture: Secondary | ICD-10-CM | POA: Diagnosis not present

## 2019-06-24 DIAGNOSIS — S81012A Laceration without foreign body, left knee, initial encounter: Secondary | ICD-10-CM | POA: Diagnosis present

## 2019-06-24 DIAGNOSIS — Y999 Unspecified external cause status: Secondary | ICD-10-CM | POA: Insufficient documentation

## 2019-06-24 DIAGNOSIS — Z23 Encounter for immunization: Secondary | ICD-10-CM | POA: Insufficient documentation

## 2019-06-24 DIAGNOSIS — S52602A Unspecified fracture of lower end of left ulna, initial encounter for closed fracture: Secondary | ICD-10-CM | POA: Diagnosis not present

## 2019-06-24 DIAGNOSIS — Y939 Activity, unspecified: Secondary | ICD-10-CM | POA: Insufficient documentation

## 2019-06-24 DIAGNOSIS — Y92410 Unspecified street and highway as the place of occurrence of the external cause: Secondary | ICD-10-CM | POA: Diagnosis not present

## 2019-06-24 DIAGNOSIS — F1210 Cannabis abuse, uncomplicated: Secondary | ICD-10-CM | POA: Insufficient documentation

## 2019-06-24 MED ORDER — TETANUS-DIPHTH-ACELL PERTUSSIS 5-2.5-18.5 LF-MCG/0.5 IM SUSP
0.5000 mL | Freq: Once | INTRAMUSCULAR | Status: AC
  Administered 2019-06-24: 0.5 mL via INTRAMUSCULAR
  Filled 2019-06-24: qty 0.5

## 2019-06-24 MED ORDER — LIDOCAINE-EPINEPHRINE 2 %-1:100000 IJ SOLN
20.0000 mL | Freq: Once | INTRAMUSCULAR | Status: AC
  Administered 2019-06-24: 20 mL
  Filled 2019-06-24: qty 1
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
## 2019-06-24 NOTE — Discharge Instructions (Signed)
Please schedule a follow-up appointment with orthopedic surgery for your wrist fracture as well as your knee laceration.  Please maintain your splint as instructed, no weightbearing in your left arm.  Please keep clean and dry, if this gets wet it will lose its structural integrity.  Please keep your wound clean and dry, can run gentle water but no harsh scrubbing.  Please avoid heavy lifting, sudden bending movements of knee as that can cause strain on the sutures.  Please return to ER if you develop redness, swelling, drainage, fever your left knee laceration.
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
## 2022-04-13 ENCOUNTER — Ambulatory Visit (HOSPITAL_COMMUNITY): Payer: Self-pay

## 2022-04-14 ENCOUNTER — Ambulatory Visit
Admission: EM | Admit: 2022-04-14 | Discharge: 2022-04-14 | Disposition: A | Discharge status: Home / Self Care | Payer: Self-pay | Attending: Family Medicine | Admitting: Family Medicine

## 2022-04-14 ENCOUNTER — Ambulatory Visit: Payer: Self-pay

## 2022-04-14 DIAGNOSIS — N760 Acute vaginitis: Secondary | ICD-10-CM

## 2022-04-14 LAB — POCT URINE PREGNANCY: Preg Test, Ur: NEGATIVE

## 2022-04-14 MED ORDER — METRONIDAZOLE 500 MG PO TABS: 500.0000 mg | ORAL_TABLET | Freq: Two times a day (BID) | ORAL | 0 refills | Status: AC

## 2022-04-14 NOTE — Discharge Instructions (Addendum)
Take metronidazole 500 mg--1 tablet 2 times daily for 7 days.  Avoid drinking alcohol within 72 hours of taking this medication  Staff will notify you of any positives on the swab

## 2022-04-14 NOTE — ED Triage Notes (Addendum)
Patient c/o vaginal discharge (white/milky).  Started: 2 days ago   Home interventions: boric acid suppository yesterday.

## 2022-04-14 NOTE — ED Provider Notes (Addendum)
[facility]    CSN: [id number] Arrival date & time: 04/14/22  6387      History   Chief Complaint Chief Complaint  Patient presents with   Vaginal Discharge    HPI Stephanie Winters is a 21 y.o. female.    Vaginal Discharge  Here for milky white vaginal discharge that began about 2 days ago.  She does have some vaginal irritation and a little bit of a smell that is unusual for her.  No abdominal pain or fever or vomiting.  Last menstrual cycle was July 21.  Has had bacterial vaginosis previously and this is very much like when she has had it before  History reviewed. No pertinent past medical history.  Patient Active Problem List   Diagnosis Date Noted   MDD (major depressive disorder), single episode, severe (HCC) 06/27/2018    History reviewed. No pertinent surgical history.  OB History   No obstetric history on file.      Home Medications    Prior to Admission medications   Medication Sig Start Date End Date Taking? Authorizing Provider  metroNIDAZOLE (FLAGYL) 500 MG tablet Take 1 tablet (500 mg total) by mouth 2 (two) times daily for 7 days. 04/14/22 04/21/22 Yes Hilding Quintanar, Janace Aris, MD    Family History History reviewed. No pertinent family history.  Social History Social History   Tobacco Use   Smoking status: Never   Smokeless tobacco: Never  Substance Use Topics   Alcohol use: Yes    Comment: Pt denied   Drug use: Yes    Types: Marijuana    Comment: Pt denied: UDS not available     Allergies   Patient has no known allergies.   Review of Systems Review of Systems  Genitourinary:  Positive for vaginal discharge.     Physical Exam Triage Vital Signs ED Triage Vitals  Enc Vitals Group     BP 04/14/22 0855 112/70     Pulse Rate 04/14/22 0855 63     Resp 04/14/22 0855 16     Temp 04/14/22 0855 98.1 F (36.7 C)     Temp Source 04/14/22 0855 Oral     SpO2 04/14/22 0855 99 %     Weight --      Height --      Head  Circumference --      Peak Flow --      Pain Score 04/14/22 0854 0     Pain Loc --      Pain Edu? --      Excl. in [place]? --    No data found.  Updated Vital Signs BP 112/70 (BP Location: Right Arm)   Pulse 63   Temp 98.1 F (36.7 C) (Oral)   Resp 16   LMP 03/27/2022   SpO2 99%   Visual Acuity Right Eye Distance:   Left Eye Distance:   Bilateral Distance:    Right Eye Near:   Left Eye Near:    Bilateral Near:     Physical Exam Vitals reviewed.  Constitutional:      General: She is not in acute distress.    Appearance: She is not ill-appearing, toxic-appearing or diaphoretic.  HENT:     Mouth/Throat:     Mouth: Mucous membranes are moist.  Cardiovascular:     Rate and Rhythm: Normal rate and regular rhythm.  Pulmonary:     Effort: Pulmonary effort is normal.     Breath sounds: Normal breath sounds.  Abdominal:  Palpations: Abdomen is soft.     Tenderness: There is no abdominal tenderness.  Skin:    Coloration: Skin is not jaundiced or pale.  Neurological:     Mental Status: She is alert and oriented to person, place, and time.  Psychiatric:        Behavior: Behavior normal.      UC Treatments / Results  Labs (all labs ordered are listed, but only abnormal results are displayed) Labs Reviewed  CERVICOVAGINAL ANCILLARY ONLY    EKG   Radiology No results found.  Procedures Procedures (including critical care time)  Medications Ordered in UC Medications - No data to display  Initial Impression / Assessment and Plan / UC Course  I have reviewed the triage vital signs and the nursing notes.  Pertinent labs & imaging results that were available during my care of the patient were reviewed by me and considered in my medical decision making (see chart for details).    UPT is negative  Staff will notify her of any positives on the swab.  Empiric treatment with metronidazole is sent in for her for possible BV Final Clinical Impressions(s) / UC  Diagnoses   Final diagnoses:  Acute vaginitis     Discharge Instructions      Take metronidazole 500 mg--1 tablet 2 times daily for 7 days.  Avoid drinking alcohol within 72 hours of taking this medication  Staff will notify you of any positives on the swab     ED Prescriptions     Medication Sig Dispense Auth. Provider   metroNIDAZOLE (FLAGYL) 500 MG tablet Take 1 tablet (500 mg total) by mouth 2 (two) times daily for 7 days. 14 tablet Saachi Zale, Janace Aris, MD      PDMP not reviewed this encounter.   Zenia Resides, MD 04/14/22 5102    Zenia Resides, MD 04/14/22 [phone]

## 2022-04-15 LAB — CERVICOVAGINAL ANCILLARY ONLY
Bacterial Vaginitis (gardnerella): POSITIVE — AB
Candida Glabrata: NEGATIVE
Candida Vaginitis: NEGATIVE
Chlamydia: NEGATIVE
Comment: NEGATIVE
Comment: NEGATIVE
Comment: NEGATIVE
Comment: NEGATIVE
Comment: NEGATIVE
Comment: NORMAL
Neisseria Gonorrhea: NEGATIVE
Trichomonas: NEGATIVE

## 2022-05-15 ENCOUNTER — Ambulatory Visit: Payer: Self-pay

## 2022-11-24 ENCOUNTER — Ambulatory Visit
Admission: EM | Admit: 2022-11-24 | Discharge: 2022-11-24 | Disposition: A | Discharge status: Home / Self Care | Payer: Medicaid Other | Attending: Nurse Practitioner | Admitting: Nurse Practitioner

## 2022-11-24 ENCOUNTER — Ambulatory Visit: Payer: Self-pay

## 2022-11-24 DIAGNOSIS — N898 Other specified noninflammatory disorders of vagina: Secondary | ICD-10-CM | POA: Diagnosis not present

## 2022-11-24 DIAGNOSIS — N760 Acute vaginitis: Secondary | ICD-10-CM | POA: Diagnosis present

## 2022-11-24 LAB — POCT URINE PREGNANCY: Preg Test, Ur: NEGATIVE

## 2022-11-24 MED ORDER — METRONIDAZOLE 500 MG PO TABS: 500.0000 mg | ORAL_TABLET | Freq: Two times a day (BID) | ORAL | 0 refills | Status: AC

## 2022-11-24 NOTE — ED Triage Notes (Signed)
Pt presents with c/o vaginal discharge X 2 days.   Denies other sxs. Pt states she has had BV before.

## 2022-11-24 NOTE — ED Provider Notes (Signed)
[facility]    CSN: [id number] Arrival date & time: 11/24/22  1013      History   Chief Complaint Chief Complaint  Patient presents with   Vaginal Discharge    HPI Kevon Arman is a 22 y.o. female presents for evaluation of vaginal discharge.  Patient reports 2 days of a smelly mildly pruritic vaginal discharge.  Denies any dysuria, fevers, chills, flank pain, hematuria.  No STD exposure or concern.  Does have a history of BV and states these are similar symptoms.  She has been taking cranberry pills OTC.  No other concerns at this time   Vaginal Discharge   History reviewed. No pertinent past medical history.  Patient Active Problem List   Diagnosis Date Noted   MDD (major depressive disorder), single episode, severe ([place]) 06/27/2018    History reviewed. No pertinent surgical history.  OB History   No obstetric history on file.      Home Medications    Prior to Admission medications   Medication Sig Start Date End Date Taking? Authorizing Provider  metroNIDAZOLE (FLAGYL) 500 MG tablet Take 1 tablet (500 mg total) by mouth 2 (two) times daily. 11/24/22  Yes Melynda Ripple, NP    Family History History reviewed. No pertinent family history.  Social History Social History   Tobacco Use   Smoking status: Never   Smokeless tobacco: Never  Substance Use Topics   Alcohol use: Yes    Comment: Pt denied   Drug use: Yes    Types: Marijuana    Comment: Pt denied: UDS not available     Allergies   Patient has no known allergies.   Review of Systems Review of Systems  Genitourinary:  Positive for vaginal discharge.     Physical Exam Triage Vital Signs ED Triage Vitals  Enc Vitals Group     BP 11/24/22 1045 120/68     Pulse Rate 11/24/22 1044 67     Resp 11/24/22 1044 19     Temp 11/24/22 1044 98.6 F (37 C)     Temp Source 11/24/22 1044 Oral     SpO2 11/24/22 1044 98 %     Weight --      Height --      Head Circumference --      Peak  Flow --      Pain Score 11/24/22 1043 0     Pain Loc --      Pain Edu? --      Excl. in [place]? --    No data found.  Updated Vital Signs BP 120/68   Pulse 67   Temp 98.6 F (37 C) (Oral)   Resp 19   LMP 11/09/2022 (Exact Date)   SpO2 98%   Visual Acuity Right Eye Distance:   Left Eye Distance:   Bilateral Distance:    Right Eye Near:   Left Eye Near:    Bilateral Near:     Physical Exam Vitals and nursing note reviewed.  Constitutional:      Appearance: Normal appearance.  HENT:     Head: Normocephalic and atraumatic.  Eyes:     Pupils: Pupils are equal, round, and reactive to light.  Cardiovascular:     Rate and Rhythm: Normal rate.  Pulmonary:     Effort: Pulmonary effort is normal.  Abdominal:     Tenderness: There is no right CVA tenderness or left CVA tenderness.  Skin:    General: Skin is warm and  dry.  Neurological:     General: No focal deficit present.     Mental Status: She is alert and oriented to person, place, and time.  Psychiatric:        Mood and Affect: Mood normal.        Behavior: Behavior normal.      UC Treatments / Results  Labs (all labs ordered are listed, but only abnormal results are displayed) Labs Reviewed  POCT URINE PREGNANCY  CERVICOVAGINAL ANCILLARY ONLY    EKG   Radiology No results found.  Procedures Procedures (including critical care time)  Medications Ordered in UC Medications - No data to display  Initial Impression / Assessment and Plan / UC Course  I have reviewed the triage vital signs and the nursing notes.  Pertinent labs & imaging results that were available during my care of the patient were reviewed by me and considered in my medical decision making (see chart for details).     Negative hCG STD testing is ordered and will contact for any positive results Will start treatment for BV as patient reports symptoms are similar.  Flagyl twice daily for 7 days and side effect profile  reviewed Follow-up with PCP if symptoms do not improve ER precautions reviewed and patient verbalized understanding Final Clinical Impressions(s) / UC Diagnoses   Final diagnoses:  Vaginal discharge  Acute vaginitis     Discharge Instructions      Start Flagyl twice daily for 7 days The clinic will contact you with results of the testing done today if any results are positive Follow-up with your PCP if your symptoms do not improve    ED Prescriptions     Medication Sig Dispense Auth. Provider   metroNIDAZOLE (FLAGYL) 500 MG tablet Take 1 tablet (500 mg total) by mouth 2 (two) times daily. 14 tablet Melynda Ripple, NP      PDMP not reviewed this encounter.   Melynda Ripple, NP 11/24/22 1112

## 2022-11-24 NOTE — Discharge Instructions (Signed)
Start Flagyl twice daily for 7 days The clinic will contact you with results of the testing done today if any results are positive Follow-up with your PCP if your symptoms do not improve

## 2022-11-25 LAB — CERVICOVAGINAL ANCILLARY ONLY
Bacterial Vaginitis (gardnerella): NEGATIVE
Candida Glabrata: NEGATIVE
Candida Vaginitis: NEGATIVE
Chlamydia: NEGATIVE
Comment: NEGATIVE
Comment: NEGATIVE
Comment: NEGATIVE
Comment: NEGATIVE
Comment: NEGATIVE
Comment: NORMAL
Neisseria Gonorrhea: NEGATIVE
Trichomonas: NEGATIVE
